# Patient Record
Sex: Male | Born: 1965 | Race: White | Hispanic: No | Smoking: Never smoker
Health system: Southern US, Community
[De-identification: ages and names within clinical notes are randomized; demographics above are authoritative.]

## PROBLEM LIST (undated history)

## (undated) DIAGNOSIS — K921 Melena: Secondary | ICD-10-CM

## (undated) DIAGNOSIS — Z87442 Personal history of urinary calculi: Secondary | ICD-10-CM

## (undated) DIAGNOSIS — Z8719 Personal history of other diseases of the digestive system: Secondary | ICD-10-CM

## (undated) DIAGNOSIS — N119 Chronic tubulo-interstitial nephritis, unspecified: Secondary | ICD-10-CM

## (undated) DIAGNOSIS — E213 Hyperparathyroidism, unspecified: Secondary | ICD-10-CM

## (undated) DIAGNOSIS — Z8619 Personal history of other infectious and parasitic diseases: Secondary | ICD-10-CM

## (undated) HISTORY — DX: Hyperparathyroidism, unspecified: E21.3

## (undated) HISTORY — DX: Personal history of urinary calculi: Z87.442

## (undated) HISTORY — DX: Personal history of other infectious and parasitic diseases: Z86.19

## (undated) HISTORY — DX: Personal history of other diseases of the digestive system: Z87.19

## (undated) HISTORY — DX: Chronic tubulo-interstitial nephritis, unspecified: N11.9

## (undated) HISTORY — DX: Melena: K92.1

---
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
## 1998-02-22 HISTORY — PX: PARATHYROIDECTOMY: SHX19

## 1998-07-24 ENCOUNTER — Encounter (HOSPITAL_BASED_OUTPATIENT_CLINIC_OR_DEPARTMENT_OTHER): Payer: Self-pay | Admitting: General Surgery

## 1998-07-24 ENCOUNTER — Ambulatory Visit (HOSPITAL_COMMUNITY): Admission: RE | Admit: 1998-07-24 | Discharge: 1998-07-24 | Payer: Self-pay | Admitting: General Surgery

## 1998-08-01 ENCOUNTER — Encounter (HOSPITAL_BASED_OUTPATIENT_CLINIC_OR_DEPARTMENT_OTHER): Payer: Self-pay | Admitting: General Surgery

## 1998-08-04 ENCOUNTER — Ambulatory Visit (HOSPITAL_COMMUNITY): Admission: RE | Admit: 1998-08-04 | Discharge: 1998-08-05 | Payer: Self-pay | Admitting: General Surgery

## 1998-10-02 ENCOUNTER — Ambulatory Visit (HOSPITAL_COMMUNITY): Admission: RE | Admit: 1998-10-02 | Discharge: 1998-10-02 | Payer: Self-pay | Admitting: Urology

## 1998-10-02 ENCOUNTER — Encounter: Payer: Self-pay | Admitting: Urology

## 1999-02-20 ENCOUNTER — Other Ambulatory Visit: Admission: RE | Admit: 1999-02-20 | Discharge: 1999-02-20 | Payer: Self-pay | Admitting: Urology

## 1999-10-05 ENCOUNTER — Encounter: Admission: RE | Admit: 1999-10-05 | Discharge: 1999-10-05 | Payer: Self-pay | Admitting: Urology

## 1999-10-05 ENCOUNTER — Encounter: Payer: Self-pay | Admitting: Urology

## 2000-03-24 ENCOUNTER — Encounter: Admission: RE | Admit: 2000-03-24 | Discharge: 2000-03-24 | Payer: Self-pay | Admitting: Nephrology

## 2000-03-24 ENCOUNTER — Encounter: Payer: Self-pay | Admitting: Nephrology

## 2001-04-21 ENCOUNTER — Encounter: Payer: Self-pay | Admitting: Nephrology

## 2001-04-21 ENCOUNTER — Encounter: Admission: RE | Admit: 2001-04-21 | Discharge: 2001-04-21 | Payer: Self-pay | Admitting: Nephrology

## 2002-10-04 ENCOUNTER — Ambulatory Visit (HOSPITAL_BASED_OUTPATIENT_CLINIC_OR_DEPARTMENT_OTHER): Admission: RE | Admit: 2002-10-04 | Discharge: 2002-10-04 | Payer: Self-pay | Admitting: Urology

## 2002-10-04 ENCOUNTER — Encounter: Payer: Self-pay | Admitting: Urology

## 2005-04-30 ENCOUNTER — Emergency Department (HOSPITAL_COMMUNITY): Admission: EM | Admit: 2005-04-30 | Discharge: 2005-04-30 | Payer: Self-pay | Admitting: Emergency Medicine

## 2010-06-21 ENCOUNTER — Emergency Department (HOSPITAL_COMMUNITY)
Admission: EM | Admit: 2010-06-21 | Discharge: 2010-06-21 | Disposition: A | Discharge status: Home / Self Care | Payer: 59 | Attending: Emergency Medicine | Admitting: Emergency Medicine

## 2010-06-21 DIAGNOSIS — S61209A Unspecified open wound of unspecified finger without damage to nail, initial encounter: Secondary | ICD-10-CM | POA: Insufficient documentation

## 2010-06-21 DIAGNOSIS — W260XXA Contact with knife, initial encounter: Secondary | ICD-10-CM | POA: Insufficient documentation

## 2010-06-21 DIAGNOSIS — Y92009 Unspecified place in unspecified non-institutional (private) residence as the place of occurrence of the external cause: Secondary | ICD-10-CM | POA: Insufficient documentation

## 2010-06-21 DIAGNOSIS — Z79899 Other long term (current) drug therapy: Secondary | ICD-10-CM | POA: Insufficient documentation

## 2010-06-21 DIAGNOSIS — M79609 Pain in unspecified limb: Secondary | ICD-10-CM | POA: Insufficient documentation

## 2011-06-25 ENCOUNTER — Other Ambulatory Visit: Payer: Self-pay | Admitting: Family Medicine

## 2011-06-25 DIAGNOSIS — E039 Hypothyroidism, unspecified: Secondary | ICD-10-CM

## 2011-06-28 ENCOUNTER — Other Ambulatory Visit: Payer: 59

## 2012-06-12 ENCOUNTER — Encounter: Payer: Self-pay | Admitting: *Deleted

## 2013-10-16 LAB — HM COLONOSCOPY

## 2014-05-30 ENCOUNTER — Other Ambulatory Visit: Payer: Self-pay | Admitting: Family Medicine

## 2014-05-30 ENCOUNTER — Ambulatory Visit
Admission: RE | Admit: 2014-05-30 | Discharge: 2014-05-30 | Disposition: A | Discharge status: Home / Self Care | Payer: 59 | Source: Ambulatory Visit | Attending: Family Medicine | Admitting: Family Medicine

## 2014-05-30 DIAGNOSIS — R059 Cough, unspecified: Secondary | ICD-10-CM

## 2014-05-30 DIAGNOSIS — R05 Cough: Secondary | ICD-10-CM

## 2014-07-16 ENCOUNTER — Telehealth: Payer: Self-pay | Admitting: *Deleted

## 2014-07-16 NOTE — Telephone Encounter (Signed)
Unable to reach patient at time of Pre-Visit Call.  Left message for patient to return call when available.    

## 2014-07-17 ENCOUNTER — Encounter: Payer: Self-pay | Admitting: Family

## 2014-07-17 ENCOUNTER — Ambulatory Visit (INDEPENDENT_AMBULATORY_CARE_PROVIDER_SITE_OTHER): Payer: 59 | Admitting: Family

## 2014-07-17 ENCOUNTER — Telehealth: Payer: Self-pay | Admitting: Family

## 2014-07-17 VITALS — BP 109/83 | HR 107 | Temp 98.1°F | Resp 16 | Ht 73.0 in | Wt 222.0 lb

## 2014-07-17 DIAGNOSIS — M25569 Pain in unspecified knee: Secondary | ICD-10-CM | POA: Insufficient documentation

## 2014-07-17 DIAGNOSIS — L989 Disorder of the skin and subcutaneous tissue, unspecified: Secondary | ICD-10-CM | POA: Insufficient documentation

## 2014-07-17 DIAGNOSIS — Z0000 Encounter for general adult medical examination without abnormal findings: Secondary | ICD-10-CM | POA: Diagnosis not present

## 2014-07-17 DIAGNOSIS — Z87442 Personal history of urinary calculi: Secondary | ICD-10-CM | POA: Insufficient documentation

## 2014-07-17 DIAGNOSIS — E213 Hyperparathyroidism, unspecified: Secondary | ICD-10-CM | POA: Insufficient documentation

## 2014-07-17 DIAGNOSIS — F411 Generalized anxiety disorder: Secondary | ICD-10-CM

## 2014-07-17 DIAGNOSIS — E039 Hypothyroidism, unspecified: Secondary | ICD-10-CM | POA: Diagnosis not present

## 2014-07-17 LAB — BASIC METABOLIC PANEL
BUN: 19 mg/dL (ref 6–23)
CALCIUM: 10.3 mg/dL (ref 8.4–10.5)
CO2: 30 meq/L (ref 19–32)
Chloride: 99 mEq/L (ref 96–112)
Creatinine, Ser: 1.71 mg/dL — ABNORMAL HIGH (ref 0.40–1.50)
GFR: 45.11 mL/min — ABNORMAL LOW (ref >60.00)
Glucose, Bld: 95 mg/dL (ref 70–99)
Potassium: 3.6 mEq/L (ref 3.5–5.1)
SODIUM: 137 meq/L (ref 135–145)

## 2014-07-17 LAB — TSH: TSH: 5.27 u[IU]/mL — AB (ref 0.35–4.50)

## 2014-07-17 MED ORDER — ESCITALOPRAM OXALATE 10 MG PO TABS: ORAL_TABLET | ORAL | Status: DC

## 2014-07-17 NOTE — Assessment & Plan Note (Signed)
Trial of lexapro. Follow up in 1 month.

## 2014-07-17 NOTE — Assessment & Plan Note (Signed)
clinically stable on synthroid, continue same, obtain tsh.

## 2014-07-17 NOTE — Assessment & Plan Note (Signed)
Being managed by ortho (Dr. Magnus IvanBlackman)

## 2014-07-17 NOTE — Progress Notes (Signed)
Pre visit review using our clinic review tool, if applicable. No additional management support is needed unless otherwise documented below in the visit note. 

## 2014-07-17 NOTE — Progress Notes (Signed)
Subjective:    Patient ID: Dylan Ray, male    DOB: [birth year], 49 y.o.   MRN: [id number]  HPI  Dylan Ray is a 49 yr old male who presents today to establish care.   Pmhx is significant for the following:  Blood in the stool due to naproxyn.  Reports that this occurred "almost 30 years ago." He had colo last year neg except divertucla.  Denies hx of diverticultis.  Kidney stones- has seen Dr. Brunilda Payor remotely.   Hyperparathyroidism/chronic kidney disease- he is followed by Dr. Darrick Penna.  He has had parathyroidectomy.  He is not followed by Endocrinology.    Hypothyroid- has seen Dr. Paulino Rily in the past. Reports that his dose has been stable at .    Knee pain- (left knee) following with Dr. Allie Bossier.   Anxiety-  + anxiety related to recent divorce and financial stress. Reports that he used to take lexapro in the past and would like to restart. Reports frequently feeling panic when he begins to think about money. Spends a lot of time alone at night since his divorce which is difficult at time. Denies SI/HI.    Requests derm referral for skin lesion on his left cheek and lesion beneath the hairline left frontal scalp.   Review of Systems  Constitutional: Negative for unexpected weight change.  HENT: Negative for rhinorrhea.   Respiratory: Negative for cough.   Cardiovascular: Negative for chest pain.  Gastrointestinal: Negative for blood in stool.  Genitourinary: Negative for dysuria and frequency.  Musculoskeletal: Positive for arthralgias.  Skin: Negative for rash.  Neurological: Negative for headaches.  Hematological: Negative for adenopathy.   See HPI  Past Medical History  Diagnosis Date  . Blood in stool     due to Naprosyn  . History of chicken pox   . History of diverticulitis   . History of kidney stones   . Hyperparathyroidism   . Chronic kidney disease     due to hyperparathyroidism    History   Social History  . Marital Status: Married     Spouse Name: N/A  . Number of Children: N/A  . Years of Education: N/A   Occupational History  . Not on file.   Social History Main Topics  . Smoking status: Never Smoker   . Smokeless tobacco: Not on file  . Alcohol Use: 0.6 oz/week    1 Cans of beer per week  . Drug Use: No  . Sexual Activity: Not on file   Other Topics Concern  . Not on file   Social History Narrative  . No narrative on file    Past Surgical History  Procedure Laterality Date  . Parathyroidectomy  2000  . Adenoidectomy  1969    Family History  Problem Relation Age of Onset  . Arthritis Mother   . Hyperlipidemia Mother   . Hypertension Mother   . Depression Mother   . Arthritis Maternal Grandmother   . Hyperlipidemia Maternal Grandmother   . Hypertension Maternal Grandmother     Allergies  Allergen Reactions  . Erythromycin Other (See Comments)    Sour stomach  . Naprosyn [Naproxen] Other (See Comments)    Black stools (?GI bleed)    No current outpatient prescriptions on file prior to visit.   No current facility-administered medications on file prior to visit.    BP 109/83 mmHg  Pulse 107  Temp(Src) 98.1 F (36.7 C) (Oral)  Resp 16  Ht  (1.854 m)  Wt 222 lb (100.699 kg)  BMI 29.30 kg/m2  SpO2 97%       Objective:   Physical Exam  Constitutional: He is oriented to person, place, and time. He appears well-developed and well-nourished. No distress.  HENT:  Head: Normocephalic and atraumatic.  Eyes: No scleral icterus.  Neck: No thyromegaly present.  Cardiovascular: Normal rate and regular rhythm.   No murmur heard. Pulmonary/Chest: Effort normal and breath sounds normal. No respiratory distress. He has no wheezes. He has no rales.  Abdominal: Soft. He exhibits no distension. There is no tenderness.  Musculoskeletal: He exhibits no edema.  Lymphadenopathy:    He has no cervical adenopathy.  Neurological: He is alert and oriented to person, place, and time.    Skin: Skin is warm and dry.  Raised skin lesion left lower cheek and above frontal hairline on left  Psychiatric: He has a normal mood and affect. His behavior is normal. Thought content normal.          Assessment & Plan:

## 2014-07-17 NOTE — Assessment & Plan Note (Signed)
He has seen Dr. Brunilda PayorNesi remotely.  Reports that he had kidney stones prior to his parathyroidectomy. No current complaints.

## 2014-07-17 NOTE — Assessment & Plan Note (Signed)
Pt requests referral to derm. Referral made.

## 2014-07-17 NOTE — Telephone Encounter (Signed)
Left detailed message re: below recommendation and will recheck at f/u on 08/22/14 and to call if any questions.

## 2014-07-17 NOTE — Assessment & Plan Note (Signed)
S/p parathyroidectomy. Not currently followed by endo. Will obtain PTH and calcium levels.

## 2014-07-17 NOTE — Patient Instructions (Signed)
Please complete lab work prior to leaving. Start lexapro 1/2 tab once daily for 1 week, then increase to a full tab once daily on week two. Please schedule a follow up appointment in 1 month. Welcome to [facility]!

## 2014-07-17 NOTE — Telephone Encounter (Signed)
Labs are showing that his Thyroid med is needing to be increased. He indicated that he often misses doses. I would recommend that he take dose daily and repeat tsh in 1 month, if necessary we will increase dose at that time.

## 2014-07-18 ENCOUNTER — Telehealth: Payer: Self-pay | Admitting: Family

## 2014-07-18 LAB — PTH, INTACT AND CALCIUM
Calcium: 10 mg/dL (ref 8.4–10.5)
PTH: 19 pg/mL (ref 14–64)

## 2014-07-18 LAB — HIV ANTIBODY (ROUTINE TESTING W REFLEX): HIV 1&2 Ab, 4th Generation: NONREACTIVE

## 2014-07-18 NOTE — Telephone Encounter (Signed)
Caller name:Fessenden Tremaine Relation to MW:UXLKpt:self Call back [phone] [facility]  Reason for call: pt states that his rx for escitalopram (LEXAPRO) 10 MG tablet, was [facility] to [facility] in [place], pt requesting that the [facility] is [facility] [address]

## 2014-07-19 ENCOUNTER — Encounter: Payer: Self-pay | Admitting: Family

## 2014-07-19 MED ORDER — ESCITALOPRAM OXALATE 10 MG PO TABS: ORAL_TABLET | ORAL | Status: DC

## 2014-07-19 NOTE — Telephone Encounter (Signed)
Rx sent to correct pharmacy. Notified pt.

## 2014-07-19 NOTE — Telephone Encounter (Signed)
Error/gd °

## 2014-08-06 ENCOUNTER — Telehealth: Payer: Self-pay | Admitting: Family

## 2014-08-06 MED ORDER — LEVOTHYROXINE SODIUM 75 MCG PO TABS: 75.0000 ug | ORAL_TABLET | Freq: Every day | ORAL | Status: DC

## 2014-08-06 NOTE — Telephone Encounter (Signed)
Refill sent. Left detailed message on voicemail re: Rx completion.

## 2014-08-06 NOTE — Telephone Encounter (Signed)
Caller name: Hamdan Relation to pt: self Call back number: [phone] Pharmacy: [facility] [address]   Reason for call:   Requesting synthroid refill

## 2014-08-12 ENCOUNTER — Telehealth: Payer: Self-pay | Admitting: *Deleted

## 2014-08-12 NOTE — Telephone Encounter (Signed)
Medical records received via fax from [facility]. Forwarded to [employer]. JG//CMA

## 2014-08-22 ENCOUNTER — Ambulatory Visit (INDEPENDENT_AMBULATORY_CARE_PROVIDER_SITE_OTHER): Payer: 59 | Admitting: Family

## 2014-08-22 ENCOUNTER — Encounter: Payer: Self-pay | Admitting: Family

## 2014-08-22 VITALS — BP 116/80 | HR 92 | Temp 97.2°F | Resp 16 | Ht 73.0 in | Wt 224.2 lb

## 2014-08-22 DIAGNOSIS — E213 Hyperparathyroidism, unspecified: Secondary | ICD-10-CM

## 2014-08-22 DIAGNOSIS — F411 Generalized anxiety disorder: Secondary | ICD-10-CM

## 2014-08-22 DIAGNOSIS — E039 Hypothyroidism, unspecified: Secondary | ICD-10-CM | POA: Diagnosis not present

## 2014-08-22 LAB — TSH: TSH: 1.55 u[IU]/mL (ref 0.35–4.50)

## 2014-08-22 MED ORDER — ESCITALOPRAM OXALATE 20 MG PO TABS: 20.0000 mg | ORAL_TABLET | Freq: Every day | ORAL | Status: DC

## 2014-08-22 NOTE — Assessment & Plan Note (Signed)
Obtain follow up TSH. Continue synthroid. 

## 2014-08-22 NOTE — Patient Instructions (Signed)
Please complete lab work prior to leaving. Increase lexapro from  to .

## 2014-08-22 NOTE — Progress Notes (Signed)
Subjective:    Patient ID: Colin Inahomas H Morimoto, male    DOB: 1965-07-16, 49 y.o.   MRN: [id number]  HPI  Mr. Ebony CargoClayton is a 49 yr old male who presents today for follow up.  1) Hypothyroid- admits to inconsistent use of levothyroxine.   2) Anxiety- currently maintained on lexapro 10mg .  Recent divorce.  Reports + anxiety.  Reports that he gets very anxious bout money but every time he thins about money he gets very anxious.  Reports that he looks forward to work.  Has been on lexapro in the past at 20mg  and feels that this dose worked better for him.    3) Hyperparathyroidism- s/p parathyroidectomy.  Had normal calcium and normal PTH in May.    Review of Systems See HPI  Past Medical History  Diagnosis Date  . Blood in stool     due to Naprosyn  . History of chicken pox   . History of diverticulitis   . History of kidney stones   . Hyperparathyroidism   . Chronic kidney disease     due to hyperparathyroidism    History   Social History  . Marital Status: Married    Spouse Name: N/A  . Number of Children: N/A  . Years of Education: N/A   Occupational History  . Not on file.   Social History Main Topics  . Smoking status: Never Smoker   . Smokeless tobacco: Not on file  . Alcohol Use: 0.6 oz/week    1 Cans of beer per week  . Drug Use: No  . Sexual Activity: Not on file   Other Topics Concern  . 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  television    Past Surgical History  Procedure Laterality Date  . Parathyroidectomy  2000  . Adenoidectomy  1969    Family History  Problem Relation Age of Onset  . Arthritis Mother   . Hyperlipidemia Mother   . Hypertension Mother   . Depression Mother   . Arthritis Maternal Grandmother   . Hyperlipidemia Maternal  Grandmother   . Hypertension Maternal Grandmother     Allergies  Allergen Reactions  . Erythromycin Other (See Comments)    Sour stomach  . Naprosyn [Naproxen] Other (See Comments)    Black stools (?GI bleed)    Current Outpatient Prescriptions on File Prior to Visit  Medication Sig Dispense Refill  . hydrochlorothiazide (HYDRODIURIL) 12.5 MG tablet Take 12.5 mg by mouth daily.    Marland Kitchen. levothyroxine (SYNTHROID, LEVOTHROID) 75 MCG tablet Take 1 tablet (75 mcg total) by mouth daily. 30 tablet 0  . potassium citrate (UROCIT-K) 10 MEQ (1080 MG) SR tablet Take 2 tablets by mouth 2 (two) times daily.      No current facility-administered medications on file prior to visit.    BP 116/80 mmHg  Pulse 92  Temp(Src) 97.2 F (36.2 C) (Oral)  Resp 16  Ht 6\' 1"  (1.854 m)  Wt 224 lb 3.2 oz (101.696 kg)  BMI 29.59 kg/m2  SpO2 98%       Objective:   Physical Exam  Constitutional: He is oriented to person, place, and time. He appears well-developed and well-nourished. No distress.  HENT:  Head: Normocephalic and atraumatic.  Cardiovascular: Normal rate and regular rhythm.  No murmur heard. Pulmonary/Chest: Effort normal and breath sounds normal. No respiratory distress. He has no wheezes. He has no rales.  Musculoskeletal: He exhibits no edema.  Neurological: He is alert and oriented to person, place, and time.  Skin: Skin is warm and dry.  Psychiatric: He has a normal mood and affect. His behavior is normal. Thought content normal.          Assessment & Plan:

## 2014-08-22 NOTE — Progress Notes (Signed)
Pre visit review using our clinic review tool, if applicable. No additional management support is needed unless otherwise documented below in the visit note. 

## 2014-08-22 NOTE — Assessment & Plan Note (Signed)
PTH and calcium normal last month.

## 2014-08-22 NOTE — Assessment & Plan Note (Signed)
Uncontrolled. Increase lexapro from  to .

## 2014-09-03 ENCOUNTER — Ambulatory Visit (HOSPITAL_BASED_OUTPATIENT_CLINIC_OR_DEPARTMENT_OTHER)
Admission: RE | Admit: 2014-09-03 | Discharge: 2014-09-03 | Disposition: A | Discharge status: Home / Self Care | Payer: 59 | Source: Ambulatory Visit | Attending: Medical | Admitting: Medical

## 2014-09-03 ENCOUNTER — Encounter: Payer: Self-pay | Admitting: Medical

## 2014-09-03 ENCOUNTER — Other Ambulatory Visit: Payer: Self-pay | Admitting: Medical

## 2014-09-03 ENCOUNTER — Ambulatory Visit (INDEPENDENT_AMBULATORY_CARE_PROVIDER_SITE_OTHER): Payer: 59 | Admitting: Medical

## 2014-09-03 VITALS — BP 135/80 | HR 98 | Temp 98.4°F | Ht 73.0 in | Wt 224.6 lb

## 2014-09-03 DIAGNOSIS — M25562 Pain in left knee: Secondary | ICD-10-CM | POA: Diagnosis not present

## 2014-09-03 DIAGNOSIS — M25462 Effusion, left knee: Secondary | ICD-10-CM | POA: Diagnosis not present

## 2014-09-03 LAB — CBC WITH DIFFERENTIAL/PLATELET
Basophils Absolute: 0 10*3/uL (ref 0.0–0.1)
Basophils Relative: 0.4 % (ref 0.0–3.0)
EOS ABS: 0.1 10*3/uL (ref 0.0–0.7)
EOS PCT: 1.1 % (ref 0.0–5.0)
HCT: 50.3 % (ref 39.0–52.0)
Hemoglobin: 16.8 g/dL (ref 13.0–17.0)
LYMPHS ABS: 1.4 10*3/uL (ref 0.7–4.0)
LYMPHS PCT: 15.4 % (ref 12.0–46.0)
MCHC: 33.3 g/dL (ref 30.0–36.0)
MCV: 88.5 fl (ref 78.0–100.0)
MONO ABS: 0.6 10*3/uL (ref 0.1–1.0)
Monocytes Relative: 6.8 % (ref 3.0–12.0)
NEUTROS PCT: 76.3 % (ref 43.0–77.0)
Neutro Abs: 7 10*3/uL (ref 1.4–7.7)
PLATELETS: 248 10*3/uL (ref 150.0–400.0)
RBC: 5.68 Mil/uL (ref 4.22–5.81)
RDW: 15.2 % (ref 11.5–15.5)
WBC: 9.2 10*3/uL (ref 4.0–10.5)

## 2014-09-03 LAB — URIC ACID: Uric Acid, Serum: 7.2 mg/dL (ref 4.0–7.8)

## 2014-09-03 MED ORDER — HYDROCODONE-ACETAMINOPHEN 5-325 MG PO TABS: 1.0000 | ORAL_TABLET | Freq: Four times a day (QID) | ORAL | Status: DC | PRN

## 2014-09-03 NOTE — Progress Notes (Signed)
Pre visit review using our clinic review tool, if applicable. No additional management support is needed unless otherwise documented below in the visit note. 

## 2014-09-03 NOTE — Assessment & Plan Note (Signed)
Xray of the left knee. Cbc, uric acid today. Take advil otc. Rx norco.  If uric acid were elevated then would dc advil and rx prednisone.  Will follow lab and xray. May refer to ortho in near future.

## 2014-09-03 NOTE — Progress Notes (Signed)
Subjective:    Patient ID: Dylan Ray, male    DOB: January 19, 1966, 49 y.o.   MRN: [id number]  HPI Pt left knee hurting Saturday, Sunday and Monday. No injury recently. But some chronic intermittent pain in the past. Pt states sees orthopedist in past. Pt may have had joint injection in the past. Pt not sure when last xray was. He mentions ortho told him if he comes in again may need arthroscopy. Pt took 4 advil this am. Did not help much. No hx of gout. But did have pseudogout in the past.   Review of Systems  Constitutional: Negative for fever, chills and fatigue.  Respiratory: Negative for chest tightness, shortness of breath and wheezing.   Cardiovascular: Negative for chest pain and palpitations.  Musculoskeletal:       Lt knee pain. Pain 4-5/10 now. This weekend 8/10.  Neurological: Negative for dizziness and light-headedness.  Hematological: Negative for adenopathy. Does not bruise/bleed easily.   Past Medical History  Diagnosis Date  . Blood in stool     due to Naprosyn  . History of chicken pox   . History of diverticulitis   . History of kidney stones   . Hyperparathyroidism   . Chronic kidney disease     due to hyperparathyroidism    History   Social History  . Marital Status: Married    Spouse Name: N/A  . Number of Children: N/A  . Years of Education: N/A   Occupational History  . Not on file.   Social History Main Topics  . Smoking status: Never Smoker   . Smokeless tobacco: Not on file  . Alcohol Use: 0.6 oz/week    1 Cans of beer per week  . Drug Use: No  . Sexual Activity: Not on file   Other Topics Concern  . 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  television    Past Surgical History  Procedure Laterality Date  . Parathyroidectomy  2000  .  Adenoidectomy  1969    Family History  Problem Relation Age of Onset  . Arthritis Mother   . Hyperlipidemia Mother   . Hypertension Mother   . Depression Mother   . Arthritis Maternal Grandmother   . Hyperlipidemia Maternal Grandmother   . Hypertension Maternal Grandmother     Allergies  Allergen Reactions  . Erythromycin Other (See Comments)    Sour stomach  . Naprosyn [Naproxen] Other (See Comments)    Black stools (?GI bleed)    Current Outpatient Prescriptions on File Prior to Visit  Medication Sig Dispense Refill  . escitalopram (LEXAPRO) 20 MG tablet Take 1 tablet (20 mg total) by mouth daily. 30 tablet 1  . hydrochlorothiazide (HYDRODIURIL) 12.5 MG tablet Take 12.5 mg by mouth daily.    Marland Kitchen. levothyroxine (SYNTHROID, LEVOTHROID) 75 MCG tablet Take 1 tablet (75 mcg total) by mouth daily. 30 tablet 0  . potassium citrate (UROCIT-K) 10 MEQ (1080 MG) SR tablet Take 2 tablets by mouth 2 (two) times daily.      No current facility-administered medications on file prior to visit.    BP 135/80 mmHg  Pulse 98  Temp(Src) 98.4 F (36.9 C) (Oral)  Ht 6\' 1"  (1.854 m)  Wt 224 lb 9.6 oz (101.878 kg)  BMI 29.64  kg/m2  SpO2 97%       Objective:   Physical Exam  General- No acute distress. Pleasant patient. Neck- Full range of motion, no jvd Lungs- Clear, even and unlabored. Heart- regular rate and rhythm. Neurologic- CNII- XII grossly intact.  Lt knee- moderate swelling- Mid warmth. Faint crepitus. No instability. Faint tenderness over lateral tibial plateau region.       Assessment & Plan:

## 2014-09-03 NOTE — Patient Instructions (Signed)
Knee pain Xray of the left knee. Cbc, uric acid today. Take advil otc. Rx norco.  If uric acid were elevated then would dc advil and rx prednisone.  Will follow lab and xray. May refer to ortho in near future.

## 2014-10-04 ENCOUNTER — Ambulatory Visit (INDEPENDENT_AMBULATORY_CARE_PROVIDER_SITE_OTHER): Payer: 59 | Admitting: Family

## 2014-10-04 ENCOUNTER — Encounter: Payer: Self-pay | Admitting: Family

## 2014-10-04 VITALS — BP 122/78 | HR 85 | Temp 97.7°F | Ht 73.0 in | Wt 225.0 lb

## 2014-10-04 DIAGNOSIS — F411 Generalized anxiety disorder: Secondary | ICD-10-CM

## 2014-10-04 MED ORDER — ESCITALOPRAM OXALATE 20 MG PO TABS: 20.0000 mg | ORAL_TABLET | Freq: Every day | ORAL | Status: DC

## 2014-10-04 NOTE — Progress Notes (Signed)
Subjective:    Patient ID: Dylan Ray, male    DOB: 09/17/1965, 49 y.o.   MRN: [id number]  HPI  Mr. Dylan Ray is a 49 yr old male who presents today for follow up of his anxiety. Last visit he noted ongoing anxiety symptoms and his lexapro dose was in creased from  to .  Reports that he is feeling better on this dose. Denies adverse side effects.   Review of Systems See HPI  Past Medical History  Diagnosis Date  . Blood in stool     due to Naprosyn  . History of chicken pox   . History of diverticulitis   . History of kidney stones   . Hyperparathyroidism   . Chronic kidney disease     due to hyperparathyroidism    Social History   Social History  . Marital Status: Married    Spouse Name: N/A  . Number of Children: N/A  . Years of Education: N/A   Occupational History  . Not on file.   Social History Main Topics  . Smoking status: Never Smoker   . Smokeless tobacco: Not on file  . Alcohol Use: 0.6 oz/week    1 Cans of beer per week  . Drug Use: No  . Sexual Activity: Not on file   Other Topics Concern  . 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  television    Past Surgical History  Procedure Laterality Date  . Parathyroidectomy  2000  . Adenoidectomy  1969    Family History  Problem Relation Age of Onset  . Arthritis Mother   . Hyperlipidemia Mother   . Hypertension Mother   . Depression Mother   . Arthritis Maternal Grandmother   . Hyperlipidemia Maternal Grandmother   . Hypertension Maternal Grandmother     Allergies  Allergen Reactions  . Erythromycin Other (See Comments)    Sour stomach  . Naprosyn [Naproxen] Other (See Comments)    Black stools (?GI bleed)    Current Outpatient Prescriptions on File Prior to Visit  Medication  Sig Dispense Refill  . escitalopram (LEXAPRO) 20 MG tablet Take 1 tablet (20 mg total) by mouth daily. 30 tablet 1  . hydrochlorothiazide (HYDRODIURIL) 12.5 MG tablet Take 12.5 mg by mouth daily.    Marland Kitchen HYDROcodone-acetaminophen (NORCO) 5-325 MG per tablet Take 1 tablet by mouth every 6 (six) hours as needed for moderate pain. 20 tablet 0  . levothyroxine (SYNTHROID, LEVOTHROID) 75 MCG tablet Take 1 tablet (75 mcg total) by mouth daily. 30 tablet 0  . potassium citrate (UROCIT-K) 10 MEQ (1080 MG) SR tablet Take 2 tablets by mouth 2 (two) times daily.      No current facility-administered medications on file prior to visit.    BP 122/78 mmHg  Pulse 85  Temp(Src) 97.7 F (36.5 C) (Oral)  Ht  (1.854 m)  Wt 225 lb (102.059 kg)  BMI 29.69 kg/m2  SpO2 96%       Objective:   Physical Exam  Constitutional: He is oriented to person, place, and time. He appears well-developed and well-nourished.  Neurological: He is alert and oriented to person, place, and time.  Psychiatric: He has a normal mood and affect. His behavior is normal. Judgment and thought content normal.  Assessment & Plan:

## 2014-10-04 NOTE — Patient Instructions (Signed)
Continue lexapro  ?

## 2014-10-04 NOTE — Assessment & Plan Note (Signed)
Stable on lexapro.  Continue same . 

## 2014-10-04 NOTE — Progress Notes (Signed)
Pre visit review using our clinic review tool, if applicable. No additional management support is needed unless otherwise documented below in the visit note. 

## 2014-10-17 ENCOUNTER — Encounter: Payer: Self-pay | Admitting: Family

## 2014-11-08 ENCOUNTER — Encounter: Payer: Self-pay | Admitting: Medical

## 2014-11-08 ENCOUNTER — Ambulatory Visit (INDEPENDENT_AMBULATORY_CARE_PROVIDER_SITE_OTHER): Payer: 59 | Admitting: Medical

## 2014-11-08 VITALS — BP 131/85 | HR 95 | Temp 97.8°F | Ht 73.0 in | Wt 227.6 lb

## 2014-11-08 DIAGNOSIS — M545 Low back pain, unspecified: Secondary | ICD-10-CM

## 2014-11-08 DIAGNOSIS — M62830 Muscle spasm of back: Secondary | ICD-10-CM

## 2014-11-08 MED ORDER — TIZANIDINE HCL 6 MG PO CAPS: 6.0000 mg | ORAL_CAPSULE | Freq: Three times a day (TID) | ORAL | Status: DC | PRN

## 2014-11-08 MED ORDER — KETOROLAC TROMETHAMINE 60 MG/2ML IM SOLN
60.0000 mg | Freq: Once | INTRAMUSCULAR | Status: AC
  Administered 2014-11-08: 60 mg via INTRAMUSCULAR

## 2014-11-08 NOTE — Progress Notes (Signed)
Subjective:    Patient ID: Dylan Ray, male    DOB: 12-12-1965, 49 y.o.   MRN: [id number]  HPI   Pt in with lower back pain and spasms. This is going on for 8 days. Pt states he did not do anything abnormal(no injury). Occasional pain in back but more on rt side. Daily spasms and pain. On and off pain in back in past. Self limited and often in past responds to toradol quickly.  Pt is taking motrin 800 mg for this.   No pain shooting down legs, no saddle anesthesia, no weakness of leg . No numbness. No foot drop. Pt states old rx hydrocodone expired.   Review of Systems  Constitutional: Negative for fever, chills and fatigue.  Respiratory: Negative for cough, chest tightness and wheezing.   Cardiovascular: Negative for chest pain and palpitations.  Gastrointestinal: Negative for nausea, vomiting and abdominal pain.  Genitourinary: Negative for dysuria, urgency, hematuria and flank pain.  Musculoskeletal: Positive for back pain.  Neurological: Negative for weakness and numbness.     Past Medical History  Diagnosis Date  . Blood in stool     due to Naprosyn  . History of chicken pox   . History of diverticulitis   . History of kidney stones   . Hyperparathyroidism   . Chronic kidney disease     due to hyperparathyroidism    Social History   Social History  . Marital Status: Married    Spouse Name: N/A  . Number of Children: N/A  . Years of Education: N/A   Occupational History  . Not on file.   Social History Main Topics  . Smoking status: Never Smoker   . Smokeless tobacco: Not on file  . Alcohol Use: 0.6 oz/week    1 Cans of beer per week  . Drug Use: No  . Sexual Activity: Not on file   Other Topics Concern  . 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   television    Past Surgical History  Procedure Laterality Date  . Parathyroidectomy  2000  . Adenoidectomy  1969    Family History  Problem Relation Age of Onset  . Arthritis Mother   . Hyperlipidemia Mother   . Hypertension Mother   . Depression Mother   . Arthritis Maternal Grandmother   . Hyperlipidemia Maternal Grandmother   . Hypertension Maternal Grandmother     Allergies  Allergen Reactions  . Erythromycin Other (See Comments)    Sour stomach  . Naprosyn [Naproxen] Other (See Comments)    Black stools (?GI bleed)    Current Outpatient Prescriptions on File Prior to Visit  Medication Sig Dispense Refill  . escitalopram (LEXAPRO) 20 MG tablet Take 1 tablet (20 mg total) by mouth daily. 90 tablet 0  . hydrochlorothiazide (HYDRODIURIL) 12.5 MG tablet Take 12.5 mg by mouth daily.    Marland Kitchen HYDROcodone-acetaminophen (NORCO) 5-325 MG per tablet Take 1 tablet by mouth every 6 (six) hours as needed for moderate pain. 20 tablet 0  . levothyroxine (SYNTHROID, LEVOTHROID) 75 MCG tablet Take 1 tablet (75 mcg total) by mouth daily. 30 tablet 0  . potassium citrate (UROCIT-K) 10 MEQ (1080 MG) SR tablet Take 2 tablets by mouth 2 (two) times daily.  No current facility-administered medications on file prior to visit.    BP 131/85 mmHg  Pulse 95  Temp(Src) 97.8 F (36.6 C) (Oral)  Ht 6\' 1"  (1.854 m)  Wt 227 lb 9.6 oz (103.239 kg)  BMI 30.03 kg/m2  SpO2 96%       Objective:   Physical Exam  General Appearance- Not in acute distress.    Chest and Lung Exam Auscultation: Breath sounds:-Normal. Clear even and unlabored. Adventitious sounds:- No Adventitious sounds.  Cardiovascular Auscultation:Rythm - Regular, rate and rythm. Heart Sounds -Normal heart sounds.  Abdomen Inspection:-Inspection Normal.  Palpation/Perucssion: Palpation and Percussion of the abdomen reveal- Non Tender, No Rebound tenderness, No rigidity(Guarding) and No Palpable abdominal masses.    Liver:-Normal.  Spleen:- Normal.   Back Pain rt side para  lumbar spine tenderness to palpation. Pain on straight leg lift. Pain on lateral movements and flexion/extension of the spine.  Lower ext neurologic  L5-S1 sensation intact bilaterally. Normal patellar reflexes bilaterally. No foot drop bilaterally.      Assessment & Plan:  For your back pain. Gave toradol 60 mg im. Starting tomorrow use ibuprofen 800 mg 3 times daily. Also zanaflex and back stretching exercises. Rx advisement given. If pain persisting then PT and consider xray.  Follow up in 10 days or as needed

## 2014-11-08 NOTE — Progress Notes (Signed)
Pre visit review using our clinic review tool, if applicable. No additional management support is needed unless otherwise documented below in the visit note. 

## 2014-11-08 NOTE — Patient Instructions (Signed)
For your back pain. Gave toradol 60 mg im. Starting tomorrow use ibuprofen 800 mg 3 times daily. Also zanaflex and back stretching exercises. Rx advisement given. If pain persisting then PT and consider xray.  Follow up in 10 days or as needed

## 2014-11-15 ENCOUNTER — Encounter: Payer: Self-pay | Admitting: Family

## 2014-11-15 DIAGNOSIS — F32A Depression, unspecified: Secondary | ICD-10-CM | POA: Insufficient documentation

## 2014-11-15 DIAGNOSIS — E785 Hyperlipidemia, unspecified: Secondary | ICD-10-CM | POA: Insufficient documentation

## 2014-11-15 DIAGNOSIS — F329 Major depressive disorder, single episode, unspecified: Secondary | ICD-10-CM | POA: Insufficient documentation

## 2014-11-15 DIAGNOSIS — F909 Attention-deficit hyperactivity disorder, unspecified type: Secondary | ICD-10-CM | POA: Insufficient documentation

## 2014-11-15 DIAGNOSIS — E559 Vitamin D deficiency, unspecified: Secondary | ICD-10-CM | POA: Insufficient documentation

## 2014-11-15 DIAGNOSIS — N289 Disorder of kidney and ureter, unspecified: Secondary | ICD-10-CM | POA: Insufficient documentation

## 2014-12-03 ENCOUNTER — Telehealth: Payer: Self-pay | Admitting: Family

## 2014-12-03 DIAGNOSIS — N289 Disorder of kidney and ureter, unspecified: Secondary | ICD-10-CM

## 2014-12-03 NOTE — Telephone Encounter (Signed)
Pt ins requires referral. He has appt 12/20/14 11:00am with Dr. Beryle Lathe, Nephrology, Fax # [phone]. Please enter referral and f/u as needed. Pt had kidney damage/calcified kidneys years ago and does f/u routinely.

## 2014-12-03 NOTE — Telephone Encounter (Signed)
Candise Bowens-- Referral placed. Can you obtain ins. Auth?  Thanks!

## 2014-12-04 NOTE — Telephone Encounter (Signed)
marji done, [employer] # [id number]

## 2015-01-03 ENCOUNTER — Ambulatory Visit: Payer: 59 | Admitting: Family

## 2015-01-10 ENCOUNTER — Ambulatory Visit: Payer: 59 | Admitting: Family

## 2015-01-24 ENCOUNTER — Ambulatory Visit (INDEPENDENT_AMBULATORY_CARE_PROVIDER_SITE_OTHER): Payer: 59 | Admitting: Family

## 2015-01-24 ENCOUNTER — Encounter: Payer: Self-pay | Admitting: Family

## 2015-01-24 VITALS — BP 120/82 | HR 87 | Temp 97.0°F | Resp 16 | Ht 73.0 in | Wt 231.8 lb

## 2015-01-24 DIAGNOSIS — Z23 Encounter for immunization: Secondary | ICD-10-CM

## 2015-01-24 DIAGNOSIS — F411 Generalized anxiety disorder: Secondary | ICD-10-CM | POA: Diagnosis not present

## 2015-01-24 DIAGNOSIS — E039 Hypothyroidism, unspecified: Secondary | ICD-10-CM

## 2015-01-24 DIAGNOSIS — R05 Cough: Secondary | ICD-10-CM

## 2015-01-24 DIAGNOSIS — N289 Disorder of kidney and ureter, unspecified: Secondary | ICD-10-CM | POA: Diagnosis not present

## 2015-01-24 DIAGNOSIS — R059 Cough, unspecified: Secondary | ICD-10-CM

## 2015-01-24 DIAGNOSIS — E559 Vitamin D deficiency, unspecified: Secondary | ICD-10-CM | POA: Diagnosis not present

## 2015-01-24 LAB — BASIC METABOLIC PANEL
BUN: 20 mg/dL (ref 6–23)
CALCIUM: 9.7 mg/dL (ref 8.4–10.5)
CO2: 29 mEq/L (ref 19–32)
CREATININE: 1.61 mg/dL — AB (ref 0.40–1.50)
Chloride: 105 mEq/L (ref 96–112)
GFR: 48.26 mL/min — AB (ref >60.00)
Glucose, Bld: 118 mg/dL — ABNORMAL HIGH (ref 70–99)
Potassium: 4 mEq/L (ref 3.5–5.1)
Sodium: 142 mEq/L (ref 135–145)

## 2015-01-24 LAB — VITAMIN D 25 HYDROXY (VIT D DEFICIENCY, FRACTURES): VITD: 24.38 ng/mL — AB (ref 30.00–100.00)

## 2015-01-24 LAB — TSH: TSH: 7.52 u[IU]/mL — ABNORMAL HIGH (ref 0.35–4.50)

## 2015-01-24 MED ORDER — LEVOTHYROXINE SODIUM 75 MCG PO TABS: 75.0000 ug | ORAL_TABLET | Freq: Every day | ORAL | Status: DC

## 2015-01-24 MED ORDER — ESCITALOPRAM OXALATE 20 MG PO TABS: 20.0000 mg | ORAL_TABLET | Freq: Every day | ORAL | Status: DC

## 2015-01-24 NOTE — Assessment & Plan Note (Signed)
Management per nephrology. Advised pt to avoid nsaids and to follow up with ortho re: bilateral knee pain

## 2015-01-24 NOTE — Progress Notes (Signed)
Subjective:    Patient ID: Dylan Ray, male    DOB: [birth year], 49 y.o.   MRN: [id number]  HPI   Dylan Ray is a 49 yr old male who presents today for follow up.  1) anxiety-  Currently maintained on lexapro . Patient reports that he sometimes forgets to take in the AM.  Reports good mood, anxiety well controlled.    2) Cough- reports that he has had a tight AM cough in the mornings.  Cough is mild.  Cough began a few days ago.    3) Renal insufficiency-  Last visit his was noted to have renal insufficiency. He was referred to nephrology. Patient is   Sees Dr. Magnus Ivan for knee pain.  Admits to NSAID use.  4) Hypothyroid- report non-compliance with synthroid.  Lab Results  Component Value Date   TSH 1.55 08/22/2014     Review of Systems See HPI  Past Medical History  Diagnosis Date  . Blood in stool     due to Naprosyn  . History of chicken pox   . History of diverticulitis   . History of kidney stones   . Hyperparathyroidism (HCC)   . Chronic interstitial nephritis     due to hyperparathyroidism- has seen dr. Darrick Penna                                                                                                                            aa   =-                       Social History   Social History  . Marital Status: Married    Spouse Name: N/A  . Number of Children: N/A  . Years of Education: N/A   Occupational History  . Not on file.   Social History Main Topics  . Smoking status: Never Smoker   . Smokeless tobacco: Not on file  . Alcohol Use: 0.6 oz/week    1 Cans of beer per week  . Drug Use: No  . Sexual Activity: Not on file   Other Topics Concern  . 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  television    Past Surgical History    Procedure Laterality Date  . Parathyroidectomy  2000  . Adenoidectomy  1969    Family History  Problem Relation Age of Onset  . Arthritis Mother   . Hyperlipidemia Mother   . Hypertension Mother   . Depression Mother   . Arthritis Maternal Grandmother   . Hyperlipidemia Maternal Grandmother   . Hypertension Maternal Grandmother     Allergies  Allergen Reactions  . Erythromycin Other (See Comments)    Sour stomach  . Naprosyn [Naproxen] Other (  See Comments)    Black stools (?GI bleed)    Current Outpatient Prescriptions on File Prior to Visit  Medication Sig Dispense Refill  . escitalopram (LEXAPRO) 20 MG tablet Take 1 tablet (20 mg total) by mouth daily. 90 tablet 0  . hydrochlorothiazide (HYDRODIURIL) 12.5 MG tablet Take 12.5 mg by mouth daily.    Marland Kitchen. HYDROcodone-acetaminophen (NORCO) 5-325 MG per tablet Take 1 tablet by mouth every 6 (six) hours as needed for moderate pain. 20 tablet 0  . levothyroxine (SYNTHROID, LEVOTHROID) 75 MCG tablet Take 1 tablet (75 mcg total) by mouth daily. 30 tablet 0  . potassium citrate (UROCIT-K) 10 MEQ (1080 MG) SR tablet Take 2 tablets by mouth 2 (two) times daily.      No current facility-administered medications on file prior to visit.    BP 120/82 mmHg  Pulse 87  Temp(Src) 97 F (36.1 C) (Oral)  Resp 16  Ht 6\' 1"  (1.854 m)  Wt 231 lb 12.8 oz (105.144 kg)  BMI 30.59 kg/m2  SpO2 97%       Objective:   Physical Exam  Constitutional: He is oriented to person, place, and time. He appears well-developed and well-nourished. No distress.  HENT:  Head: Normocephalic and atraumatic.  Cardiovascular: Normal rate and regular rhythm.   No murmur heard. Pulmonary/Chest: Effort normal and breath sounds normal. No respiratory distress. He has no wheezes. He has no rales.  Musculoskeletal: He exhibits no edema.  Neurological: He is alert and oriented to person, place, and time.  Skin: Skin is warm and dry.  Psychiatric: He has a normal  mood and affect. His behavior is normal. Thought content normal.          Assessment & Plan:  Cough- pt with mild cough- trial of zytec once daily- pt is advised to let me know if symptoms worsen or do not improve.    tdap today.

## 2015-01-24 NOTE — Patient Instructions (Addendum)
Please complete lab work prior to leaving. Take meds daily. Avoid anti-inflammatories such motrin due to your kidneys. You may take tylenol as needed.

## 2015-01-24 NOTE — Assessment & Plan Note (Signed)
Stable on lexapro, discussed importance of med compliance.

## 2015-01-24 NOTE — Assessment & Plan Note (Signed)
Obtain TSH (expect it to be elevated).  Resume synthoid- discussed med compliance.

## 2015-01-24 NOTE — Addendum Note (Signed)
Addended by: Mervin KungFERGERSON, Camdynn Maranto A on: 01/24/2015 03:54 PM   Modules accepted: Orders

## 2015-01-24 NOTE — Progress Notes (Signed)
Pre visit review using our clinic review tool, if applicable. No additional management support is needed unless otherwise documented below in the visit note. 

## 2015-01-29 ENCOUNTER — Telehealth: Payer: Self-pay | Admitting: *Deleted

## 2015-01-29 DIAGNOSIS — E559 Vitamin D deficiency, unspecified: Secondary | ICD-10-CM

## 2015-01-29 DIAGNOSIS — E039 Hypothyroidism, unspecified: Secondary | ICD-10-CM

## 2015-01-29 NOTE — Telephone Encounter (Signed)
Pt called back in to provide a different number for call back. Pt says that he will be at [phone] after 8am.

## 2015-01-29 NOTE — Telephone Encounter (Signed)
-----   Message from Sandford CrazeMelissa O'Sullivan, NP sent at 01/26/2015  5:00 PM EST ----- Vitamin D level is low.  Advise patient to begin vit D 50000 units once weekly for 12 weeks, then repeat vit D level (dx Vit D deficiency).    TSH elevated, advise pt to restart synthroid daily, repeat tsh in 6 weeks, dx hypothyroid.   Kidney function is stable.

## 2015-01-29 NOTE — Telephone Encounter (Signed)
Notified pt and he voices understanding. He requests that I call him back at his desk [phone]) in 15 min.

## 2015-01-31 ENCOUNTER — Telehealth: Payer: Self-pay | Admitting: Family

## 2015-01-31 NOTE — Telephone Encounter (Signed)
Left message at below # to return my call.

## 2015-01-31 NOTE — Telephone Encounter (Signed)
Caller name: Self   Can be reached: [phone] (M)   Reason for call: Got him scheduled for both lab appointments and he wants the results of his Kidney Functions Test faxed to Dr. Darrick Pennaeterding

## 2015-01-31 NOTE — Telephone Encounter (Signed)
Pt called back and scheduled lab appts with the schedulers. Future lab orders entered.

## 2015-01-31 NOTE — Telephone Encounter (Signed)
Labs faxed, notified pt.

## 2015-03-13 ENCOUNTER — Other Ambulatory Visit: Payer: Self-pay

## 2015-04-17 ENCOUNTER — Other Ambulatory Visit: Payer: Self-pay

## 2015-07-25 ENCOUNTER — Ambulatory Visit: Payer: 59 | Admitting: Family

## 2017-10-11 ENCOUNTER — Emergency Department (HOSPITAL_COMMUNITY): Payer: No Typology Code available for payment source

## 2017-10-11 ENCOUNTER — Other Ambulatory Visit: Payer: Self-pay

## 2017-10-11 ENCOUNTER — Emergency Department (HOSPITAL_COMMUNITY)
Admission: EM | Admit: 2017-10-11 | Discharge: 2017-10-11 | Disposition: A | Discharge status: Home / Self Care | Payer: No Typology Code available for payment source | Attending: Emergency Medicine | Admitting: Emergency Medicine

## 2017-10-11 ENCOUNTER — Encounter (HOSPITAL_COMMUNITY): Payer: Self-pay

## 2017-10-11 DIAGNOSIS — Z79899 Other long term (current) drug therapy: Secondary | ICD-10-CM | POA: Insufficient documentation

## 2017-10-11 DIAGNOSIS — Y929 Unspecified place or not applicable: Secondary | ICD-10-CM | POA: Diagnosis not present

## 2017-10-11 DIAGNOSIS — E039 Hypothyroidism, unspecified: Secondary | ICD-10-CM | POA: Insufficient documentation

## 2017-10-11 DIAGNOSIS — S62347A Nondisplaced fracture of base of fifth metacarpal bone. left hand, initial encounter for closed fracture: Secondary | ICD-10-CM

## 2017-10-11 DIAGNOSIS — Y999 Unspecified external cause status: Secondary | ICD-10-CM | POA: Insufficient documentation

## 2017-10-11 DIAGNOSIS — S0181XA Laceration without foreign body of other part of head, initial encounter: Secondary | ICD-10-CM | POA: Diagnosis not present

## 2017-10-11 DIAGNOSIS — W01190A Fall on same level from slipping, tripping and stumbling with subsequent striking against furniture, initial encounter: Secondary | ICD-10-CM | POA: Diagnosis not present

## 2017-10-11 DIAGNOSIS — Y9389 Activity, other specified: Secondary | ICD-10-CM | POA: Insufficient documentation

## 2017-10-11 DIAGNOSIS — S0990XA Unspecified injury of head, initial encounter: Secondary | ICD-10-CM | POA: Diagnosis present

## 2017-10-11 DIAGNOSIS — Z23 Encounter for immunization: Secondary | ICD-10-CM | POA: Diagnosis not present

## 2017-10-11 MED ORDER — LIDOCAINE-EPINEPHRINE (PF) 2 %-1:200000 IJ SOLN
10.0000 mL | Freq: Once | INTRAMUSCULAR | Status: AC
  Administered 2017-10-11: 10 mL
  Filled 2017-10-11: qty 20

## 2017-10-11 MED ORDER — TETANUS-DIPHTH-ACELL PERTUSSIS 5-2.5-18.5 LF-MCG/0.5 IM SUSP
0.5000 mL | Freq: Once | INTRAMUSCULAR | Status: AC
  Administered 2017-10-11: 0.5 mL via INTRAMUSCULAR
  Filled 2017-10-11: qty 0.5

## 2017-10-11 NOTE — Discharge Instructions (Signed)
The stitches can get wet just do not scrub or soak them.  Pull on the stitches in about 7 days to see if they are ready to come out.  When they are ready to come out you should be able to pull them out easily.

## 2017-10-11 NOTE — ED Notes (Signed)
Patient verbalized understanding of discharge instructions, no questions. Patient ambulated out of ED with steady gait in no distress.  

## 2017-10-11 NOTE — ED Provider Notes (Signed)
[facility]-EMERGENCY DEPT Provider Note   CSN: [id number] Arrival date & time: 10/11/17  0932     History   Chief Complaint Chief Complaint  Patient presents with  . Fall  . Head Injury  . Hand Injury    HPI Colin Inahomas H Wileman is a 52 y.o. male.  Patient is a 52 year old male with a history of interstitial nephritis, kidney stones and hyperparathyroidism presenting today after mechanical fall.  Patient states he was coming around the corner and there was a tote in the walkway that he tripped over.  He fell forward and hit his forehead on a chair on the way down and then caught himself with his left hand.  He denies loss of consciousness.  He has no pain in his legs and was able to ambulate after the fall.  He has not felt dizzy, lightheaded, nauseated since the fall.  It was a bleeding profusely and a gauze was placed on his head.  He does note he is having pain and swelling to the left portion of his left hand.  In the past he has had surgery in this area.  But the pain and swelling today are new.  Last tetanus is unknown  The history is provided by the patient.  Fall  This is a new problem. The current episode started less than 1 hour ago. The problem occurs constantly. The problem has not changed since onset.Associated symptoms comments: Left hand injury and pain and swelling.  No LOC or nausea or dazed from fall.  NO neck pain.  No anticoagulation.. Nothing aggravates the symptoms. Nothing relieves the symptoms. He has tried nothing for the symptoms.  Head Injury    Hand Injury      Past Medical History:  Diagnosis Date  . Blood in stool    due to Naprosyn  . Chronic interstitial nephritis    due to hyperparathyroidism- has seen dr. Darrick Pennadeterding                                                                                                                            aa   =-                     . History of chicken pox   . History of diverticulitis   .  History of kidney stones   . Hyperparathyroidism [facility](HCC)     Patient Active Problem List   Diagnosis Date Noted  . Vitamin D deficiency 11/15/2014  . Renal insufficiency 11/15/2014  . Depression 11/15/2014  . ADHD (attention deficit hyperactivity disorder) 11/15/2014  . Hyperlipidemia 11/15/2014  . History of kidney stones 07/17/2014  . Hyperparathyroidism (HCC) 07/17/2014  . Hypothyroidism 07/17/2014  . Skin lesion 07/17/2014  . Knee pain 07/17/2014  . Anxiety state 07/17/2014    Past Surgical History:  Procedure Laterality Date  . ADENOIDECTOMY  1969  . PARATHYROIDECTOMY  2000  Home Medications    Prior to Admission medications   Medication Sig Start Date End Date Taking? Authorizing Provider  escitalopram (LEXAPRO) 20 MG tablet Take 1 tablet (20 mg total) by mouth daily. 01/24/15   Sandford Craze'Sullivan, Melissa, NP  hydrochlorothiazide (HYDRODIURIL) 12.5 MG tablet Take 12.5 mg by mouth daily. 06/04/14   [provider]  HYDROcodone-acetaminophen (NORCO) 5-325 MG per tablet Take 1 tablet by mouth every 6 (six) hours as needed for moderate pain. 09/03/14   Saguier, Ramon DredgeEdward, PA-C  levothyroxine (SYNTHROID, LEVOTHROID) 75 MCG tablet Take 1 tablet (75 mcg total) by mouth daily. 01/24/15   Sandford Craze'Sullivan, Melissa, NP  potassium citrate (UROCIT-K) 10 MEQ (1080 MG) SR tablet Take 2 tablets by mouth 2 (two) times daily.  06/03/14   [provider]    Family History Family History  Problem Relation Age of Onset  . Arthritis Mother   . Hyperlipidemia Mother   . Hypertension Mother   . Depression Mother   . Arthritis Maternal Grandmother   . Hyperlipidemia Maternal Grandmother   . Hypertension Maternal Grandmother     Social History Social History   Tobacco Use  . Smoking status: Never Smoker  Substance Use Topics  . Alcohol use: Yes    Alcohol/week: 1.0 standard drinks    Types: 1 Cans of beer per week  . Drug use: No     Allergies   Erythromycin and  Naprosyn [naproxen]   Review of Systems Review of Systems  All other systems reviewed and are negative.    Physical Exam Updated Vital Signs BP (!) 168/89 (BP Location: Right Arm)   Pulse 81   Temp 98.4 F (36.9 C)   Resp 16   Ht 6\' 1"  (1.854 m)   Wt 99.8 kg   SpO2 96%   BMI 29.03 kg/m   Physical Exam  Constitutional: He is oriented to person, place, and time. He appears well-developed and well-nourished. No distress.  HENT:  Head: Normocephalic. Head is with laceration.    Mouth/Throat: Oropharynx is clear and moist.  Eyes: Pupils are equal, round, and reactive to light. Conjunctivae and EOM are normal.  Neck: Normal range of motion. Neck supple. No spinous process tenderness and no muscular tenderness present. Normal range of motion present.  Cardiovascular: Normal rate, regular rhythm and intact distal pulses.  No murmur heard. Pulmonary/Chest: Effort normal and breath sounds normal. No respiratory distress. He has no wheezes. He has no rales.  Abdominal: Soft. He exhibits no distension. There is no tenderness. There is no rebound and no guarding.  Musculoskeletal: Normal range of motion. He exhibits no edema.       Left wrist: Normal.       Left hand: He exhibits tenderness and bony tenderness. Normal sensation noted. Normal strength noted.       Hands: Neurological: He is alert and oriented to person, place, and time.  Skin: Skin is warm and dry. No rash noted. No erythema.  Psychiatric: He has a normal mood and affect. His behavior is normal.  Nursing note and vitals reviewed.    ED Treatments / Results  Labs (all labs ordered are listed, but only abnormal results are displayed) Labs Reviewed - No data to display  EKG None  Radiology Dg Hand Complete Left  Result Date: 10/11/2017 CLINICAL DATA:  Left hand pain fall. EXAM: LEFT HAND - COMPLETE 3+ VIEW COMPARISON:  04/30/2005. FINDINGS: Acute fracture of the base of the left fifth metacarpal noted. Old  healed fracture noted  of the distal left fifth metacarpal. Prominent callus formation noted. Soft tissue swelling noted over the left fifth metacarpal. No other focal abnormality. No radiopaque foreign body. IMPRESSION: 1. Acute fracture at the base of the left fifth metacarpal. Adjacent prominent soft tissue swelling. No radiopaque foreign body. 2. Old healed fracture distal aspect of the left fifth metacarpal with associated prominent callus formation. Electronically Signed   By: Maisie Fus  Register   On: 10/11/2017 11:10    Procedures Procedures (including critical care time) LACERATION REPAIR Performed by: [facility] Authorized by: Gwyneth Sprout Consent: Verbal consent obtained. Risks and benefits: risks, benefits and alternatives were discussed Consent given by: patient Patient identity confirmed: provided demographic data Prepped and Draped in normal sterile fashion Wound explored  Laceration Location: forehead  Laceration Length: 3cm  No Foreign Bodies seen or palpated  Anesthesia: local infiltration  Local anesthetic: lidocaine 2% with epinephrine  Anesthetic total: 4 ml  Irrigation method: syringe Amount of cleaning: standard  Skin closure: 6.0 vicryl  Number of sutures: 7  Technique: simple interrupted  Patient tolerance: Patient tolerated the procedure well with no immediate complications.   Medications Ordered in ED Medications  Tdap (BOOSTRIX) injection 0.5 mL (has no administration in time range)  lidocaine-EPINEPHrine (XYLOCAINE W/EPI) 2 %-1:200000 (PF) injection 10 mL (has no administration in time range)     Initial Impression / Assessment and Plan / ED Course  I have reviewed the triage vital signs and the nursing notes.  Pertinent labs & imaging results that were available during my care of the patient were reviewed by me and considered in my medical decision making (see chart for details).     52 year old male who is not anticoagulated  with a mechanical fall today.  Laceration to the forehead but no loss of consciousness or signs of head injury.  No indication for head or neck CT today as patient has full range of motion of his neck without pain and is otherwise feeling normally.  Tetanus shot was updated.  Wound repair as above.  Films of the hand are pending to rule out new acute fracture.  12:04 PM Imaging shows an acute fracture at the base of the left fifth metatarsal.  Patient is left-handed in an ulnar gutter splint was applied.  Patient will follow-up with hand surgery for further care.  Final Clinical Impressions(s) / ED Diagnoses   Final diagnoses:  Forehead laceration, initial encounter  Closed nondisplaced fracture of base of fifth metacarpal bone of left hand, initial encounter    ED Discharge Orders    None       Gwyneth Sprout, MD 10/11/17 1206

## 2017-10-11 NOTE — ED Notes (Signed)
Bed: WA21 Expected date:  Expected time:  Means of arrival:  Comments: EMS 52yo mechanical fall

## 2017-10-11 NOTE — ED Triage Notes (Signed)
Patient arrives via EMS from work. Patient tripped over a box at work, laceration to head, left hand pain. -LOC, -neck/back pain, no blood thinners.  BP:146/99 HR: 96

## 2019-03-03 IMAGING — CR DG HAND COMPLETE 3+V*L*
3 series · 3 of 3 positions shown · non-contrast
Comparison: 04/30/2005.

CLINICAL DATA: Left hand pain fall.

EXAM:
LEFT HAND - COMPLETE 3+ VIEW

[x hand pa left]
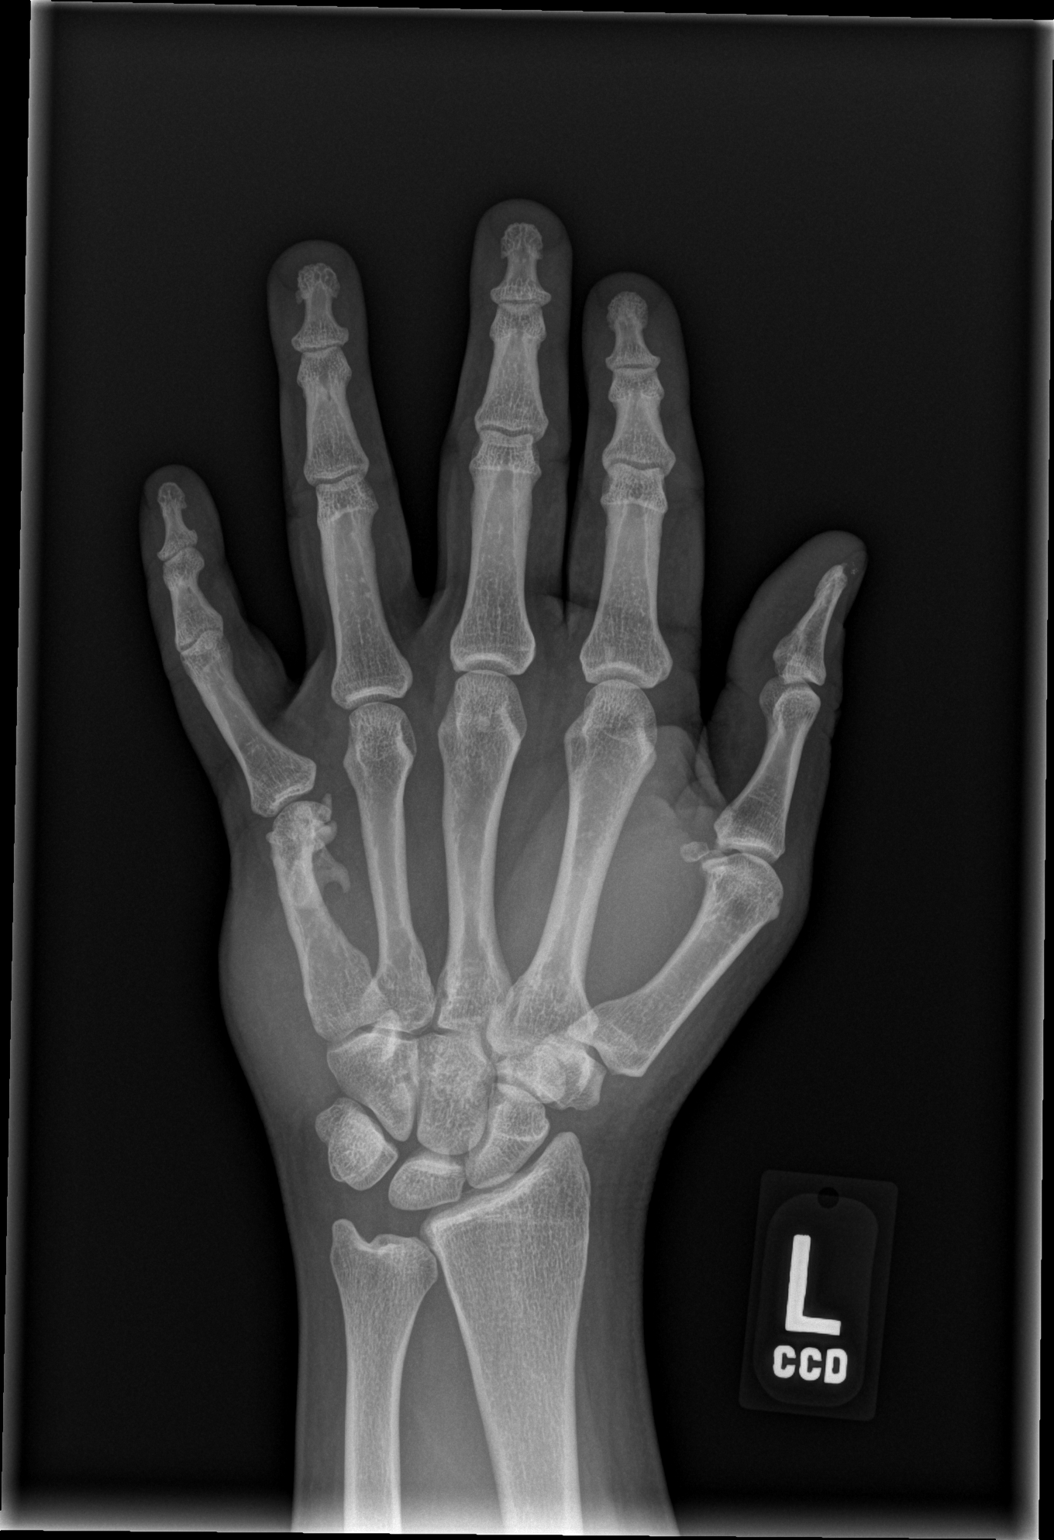

[x hand obl left]
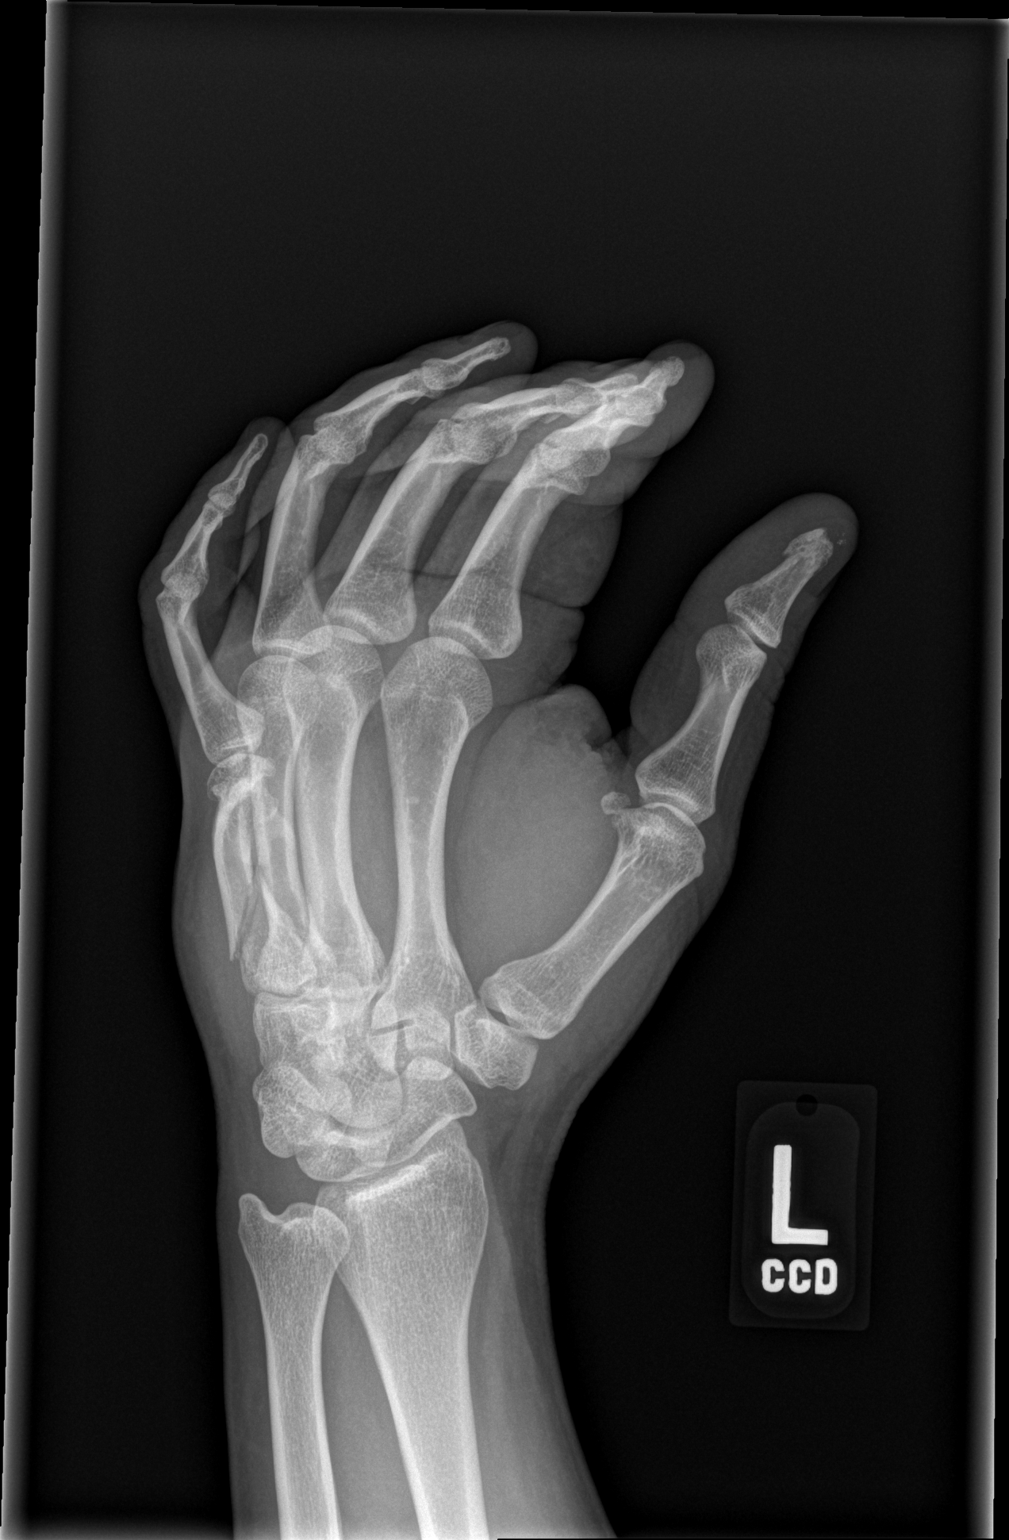

[x hand lat left]
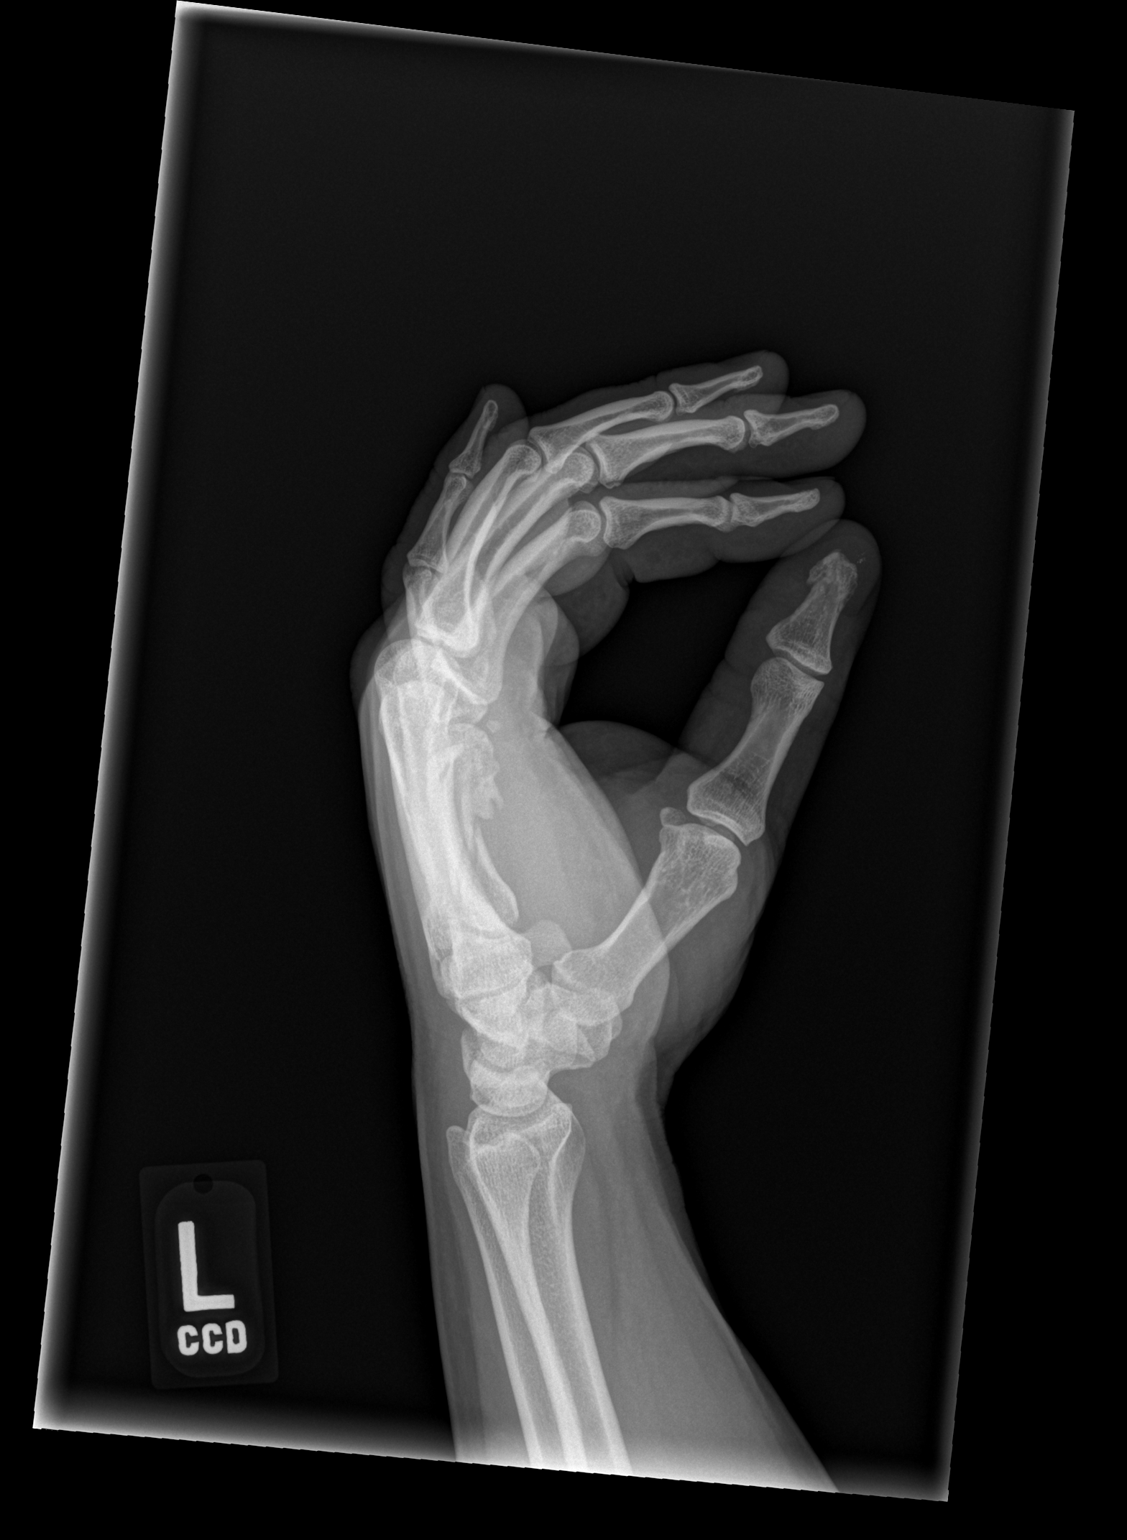

[3 of 3 positions shown; findings below may reference images not displayed]

FINDINGS: Acute fracture of the base of the left fifth metacarpal noted. Old
healed fracture noted of the distal left fifth metacarpal. Prominent
callus formation noted. Soft tissue swelling noted over the left
fifth metacarpal. No other focal abnormality. No radiopaque foreign
body.
IMPRESSION: 1. Acute fracture at the base of the left fifth metacarpal. Adjacent
prominent soft tissue swelling. No radiopaque foreign body.

2. Old healed fracture distal aspect of the left fifth metacarpal
with associated prominent callus formation.

## 2019-10-14 ENCOUNTER — Other Ambulatory Visit: Payer: Self-pay

## 2019-10-14 ENCOUNTER — Emergency Department (HOSPITAL_COMMUNITY)
Admission: EM | Admit: 2019-10-14 | Discharge: 2019-10-14 | Disposition: A | Discharge status: Home / Self Care | Payer: Self-pay | Attending: Emergency Medicine | Admitting: Emergency Medicine

## 2019-10-14 ENCOUNTER — Emergency Department (HOSPITAL_COMMUNITY): Payer: Self-pay

## 2019-10-14 ENCOUNTER — Encounter (HOSPITAL_COMMUNITY): Payer: Self-pay

## 2019-10-14 DIAGNOSIS — E039 Hypothyroidism, unspecified: Secondary | ICD-10-CM | POA: Insufficient documentation

## 2019-10-14 DIAGNOSIS — M10071 Idiopathic gout, right ankle and foot: Secondary | ICD-10-CM | POA: Insufficient documentation

## 2019-10-14 DIAGNOSIS — M109 Gout, unspecified: Secondary | ICD-10-CM

## 2019-10-14 MED ORDER — PREDNISONE 20 MG PO TABS: 40.0000 mg | ORAL_TABLET | Freq: Every day | ORAL | 0 refills | Status: AC

## 2019-10-14 MED ORDER — COLCHICINE 0.6 MG PO TABS
0.6000 mg | ORAL_TABLET | Freq: Once | ORAL | Status: AC
  Administered 2019-10-14: 0.6 mg via ORAL
  Filled 2019-10-14: qty 1

## 2019-10-14 MED ORDER — PREDNISONE 20 MG PO TABS: 40.0000 mg | ORAL_TABLET | Freq: Every day | ORAL | 0 refills | Status: DC

## 2019-10-14 MED ORDER — HYDROCODONE-ACETAMINOPHEN 5-325 MG PO TABS: 1.0000 | ORAL_TABLET | Freq: Four times a day (QID) | ORAL | 0 refills | Status: AC | PRN

## 2019-10-14 MED ORDER — PREDNISONE 20 MG PO TABS
40.0000 mg | ORAL_TABLET | Freq: Once | ORAL | Status: AC
  Administered 2019-10-14: 40 mg via ORAL
  Filled 2019-10-14: qty 2

## 2019-10-14 MED ORDER — OXYCODONE-ACETAMINOPHEN 5-325 MG PO TABS
1.0000 | ORAL_TABLET | Freq: Once | ORAL | Status: AC
  Administered 2019-10-14: 1 via ORAL
  Filled 2019-10-14: qty 1

## 2019-10-14 NOTE — ED Triage Notes (Signed)
Arrived POV from home. Patient reports right foot pain for past 2 days. Patient denies any injury but states his foot feels like it is broken.

## 2019-10-14 NOTE — ED Provider Notes (Signed)
[facility]-EMERGENCY DEPT Provider Note   CSN: [id number] Arrival date & time: 10/14/19  0500     History Chief Complaint  Patient presents with  . Foot Pain    Dylan Ray is a 54 y.o. male.  HPI   54 year old male with right foot/ankle pain. No trauma. Began about 2 days ago. Pain is been constant progressive since then. Worse with touch and any type of movement of the foot/ankle. Some mild the redness towards the medial aspect of the foot/ankle. No significant pain in any other joints. No fevers. Reports a past history of pseudogout.   Past Medical History:  Diagnosis Date  . Blood in stool    due to Naprosyn  . Chronic interstitial nephritis    due to hyperparathyroidism- has seen dr. Darrick Penna                                                                                                                            aa   =-                     . History of chicken pox   . History of diverticulitis   . History of kidney stones   . Hyperparathyroidism [facility])     Patient Active Problem List   Diagnosis Date Noted  . Vitamin D deficiency 11/15/2014  . Renal insufficiency 11/15/2014  . Depression 11/15/2014  . ADHD (attention deficit hyperactivity disorder) 11/15/2014  . Hyperlipidemia 11/15/2014  . History of kidney stones 07/17/2014  . Hyperparathyroidism (HCC) 07/17/2014  . Hypothyroidism 07/17/2014  . Skin lesion 07/17/2014  . Knee pain 07/17/2014  . Anxiety state 07/17/2014   Past Surgical History:  Procedure Laterality Date  . ADENOIDECTOMY  1969  . PARATHYROIDECTOMY  2000     Family History  Problem Relation Age of Onset  . Arthritis Mother   . Hyperlipidemia Mother   . Hypertension Mother   . Depression Mother   . Arthritis Maternal Grandmother   . Hyperlipidemia Maternal Grandmother   . Hypertension Maternal Grandmother    Social History   Tobacco Use  . Smoking status: Never Smoker  . Smokeless tobacco: Never Used    Substance Use Topics  . Alcohol use: Yes    Alcohol/week: 1.0 standard drink    Types: 1 Cans of beer per week  . Drug use: No    Home Medications Prior to Admission medications   Medication Sig Start Date End Date Taking? Authorizing Provider  ibuprofen (ADVIL) 200 MG tablet Take 600-800 mg by mouth every 6 (six) hours as needed (For back pain.).    [provider]   Allergies    Erythromycin and Naprosyn [naproxen]  Review of Systems   Review of Systems All systems reviewed and negative, other than as noted in HPI.  Physical Exam Updated Vital Signs BP 131/89   Pulse (!) 108   Temp 98.9 F (37.2 C) (Oral)  Resp 18   SpO2 93%   Physical Exam Vitals and nursing note reviewed.  Constitutional:      General: He is not in acute distress.    Appearance: He is well-developed.  HENT:     Head: Normocephalic and atraumatic.  Eyes:     General:        Right eye: No discharge.        Left eye: No discharge.     Conjunctiva/sclera: Conjunctivae normal.  Cardiovascular:     Rate and Rhythm: Normal rate and regular rhythm.     Heart sounds: Normal heart sounds. No murmur heard.  No friction rub. No gallop.   Pulmonary:     Effort: Pulmonary effort is normal. No respiratory distress.     Breath sounds: Normal breath sounds.  Abdominal:     General: There is no distension.     Palpations: Abdomen is soft.     Tenderness: There is no abdominal tenderness.  Musculoskeletal:        General: Tenderness present.     Cervical back: Neck supple.     Comments: Right lower extremity with some faint erythema along the medial forefoot and ankle. Significant pain with light touch. Severe pain in ankle with minimal passive range of motion. Palpable DP pulse.  Skin:    General: Skin is warm and dry.  Neurological:     Mental Status: He is alert.  Psychiatric:        Behavior: Behavior normal.        Thought Content: Thought content normal.     ED Results / Procedures  / Treatments   Labs (all labs ordered are listed, but only abnormal results are displayed) Labs Reviewed - No data to display  EKG None  Radiology DG Foot Complete Right  Result Date: 10/14/2019 CLINICAL DATA:  54 year old male with history of foot pain for the past 2 days. EXAM: RIGHT FOOT COMPLETE - 3+ VIEW COMPARISON:  No priors. FINDINGS: There is no evidence of fracture or dislocation. There is no evidence of arthropathy or other focal bone abnormality. Small enthesophytes off the plantar and dorsal aspect of the calcaneus. Soft tissues are unremarkable. IMPRESSION: No acute radiographic abnormality of the right foot. Electronically Signed   By: Trudie Reed M.D.   On: 10/14/2019 06:32    Procedures Procedures (including critical care time)  Medications Ordered in ED Medications  predniSONE (DELTASONE) tablet 40 mg (has no administration in time range)  oxyCODONE-acetaminophen (PERCOCET/ROXICET) 5-325 MG per tablet 1 tablet (has no administration in time range)  colchicine tablet 0.6 mg (has no administration in time range)    ED Course  I have reviewed the triage vital signs and the nursing notes.  Pertinent labs & imaging results that were available during my care of the patient were reviewed by me and considered in my medical decision making (see chart for details).    MDM Rules/Calculators/A&P                         54 year old male with atraumatic right foot/ankle pain. Clinically suspect gout. He reports history of mild renal renal impairment. Does drink "about a beer a day." Imaging negative. Afebrile. Doubt septic joint. 1 dose of colchicine here in the emergency room. Given reported renal insufficiency, will defer from additional doses and NSAIDs. PRN pain meds. Steroids. Outpt FU.    Final Clinical Impression(s) / ED Diagnoses Final diagnoses:  Acute gout of right  foot, unspecified cause    Rx / DC Orders ED Discharge Orders    None       Raeford Razor, MD 10/14/19 [phone]

## 2021-03-05 IMAGING — CR DG FOOT COMPLETE 3+V*R*
3 series · 3 of 3 positions shown · non-contrast
Comparison: No priors.

CLINICAL DATA: 54-year-old male with history of foot pain for the
past 2 days.

EXAM:
RIGHT FOOT COMPLETE - 3+ VIEW

[x foot ap right]
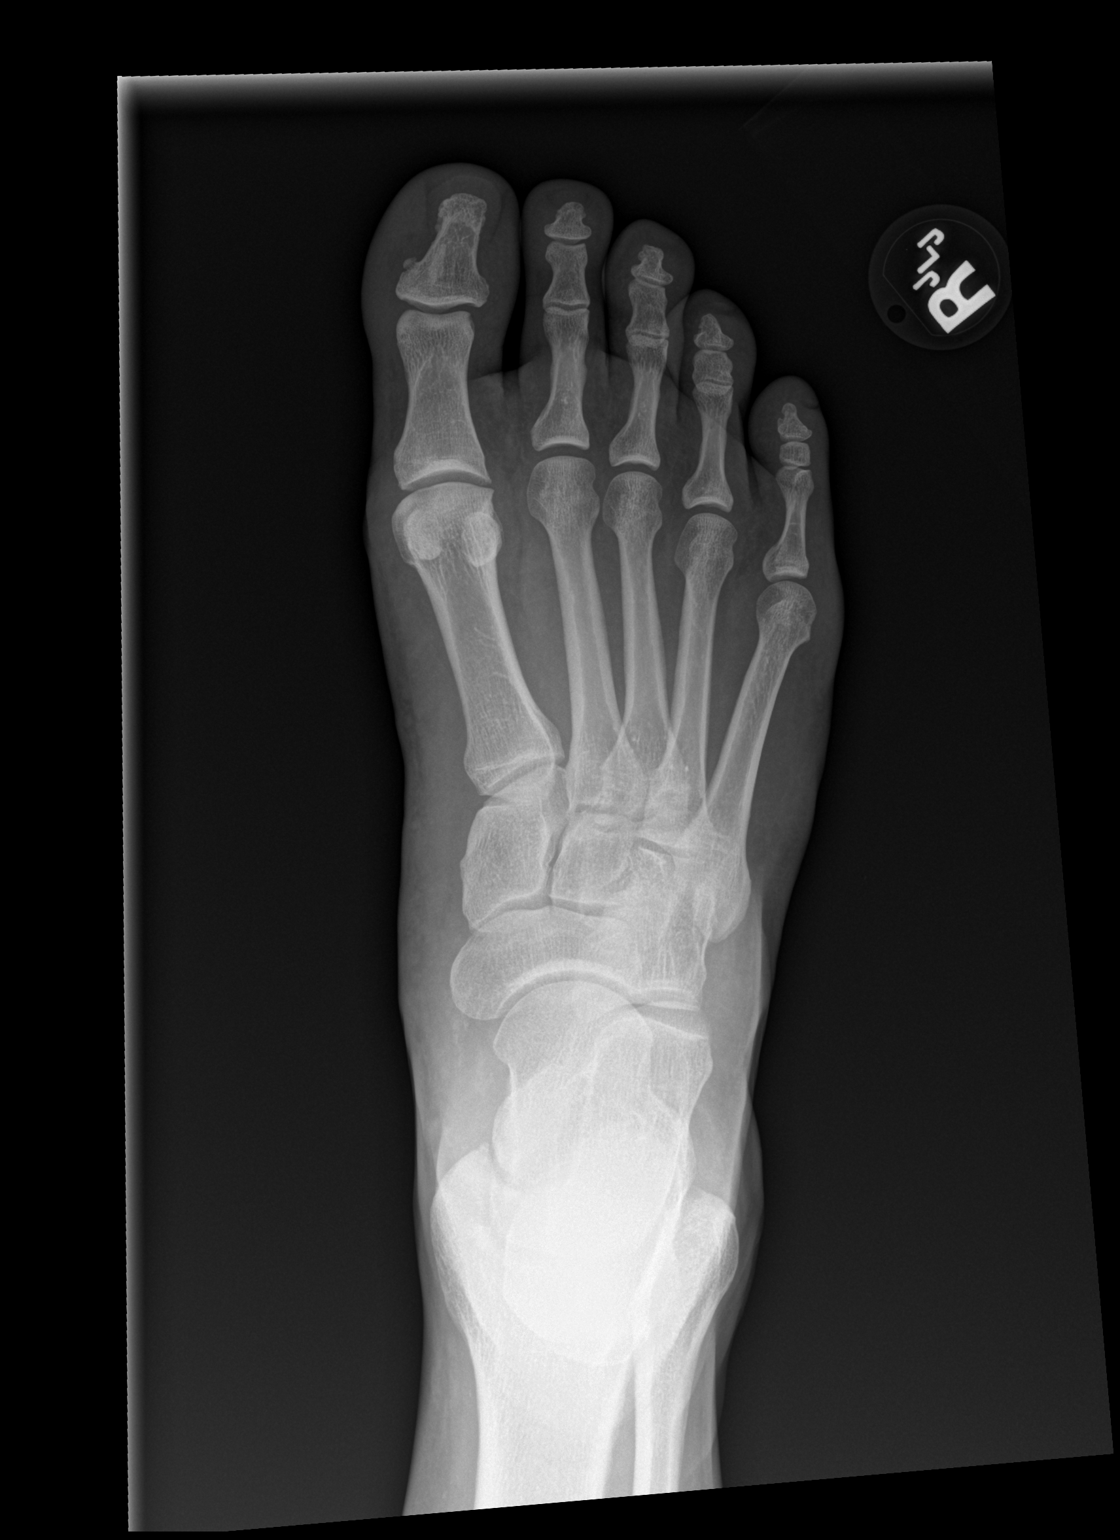

[x foot obl right]
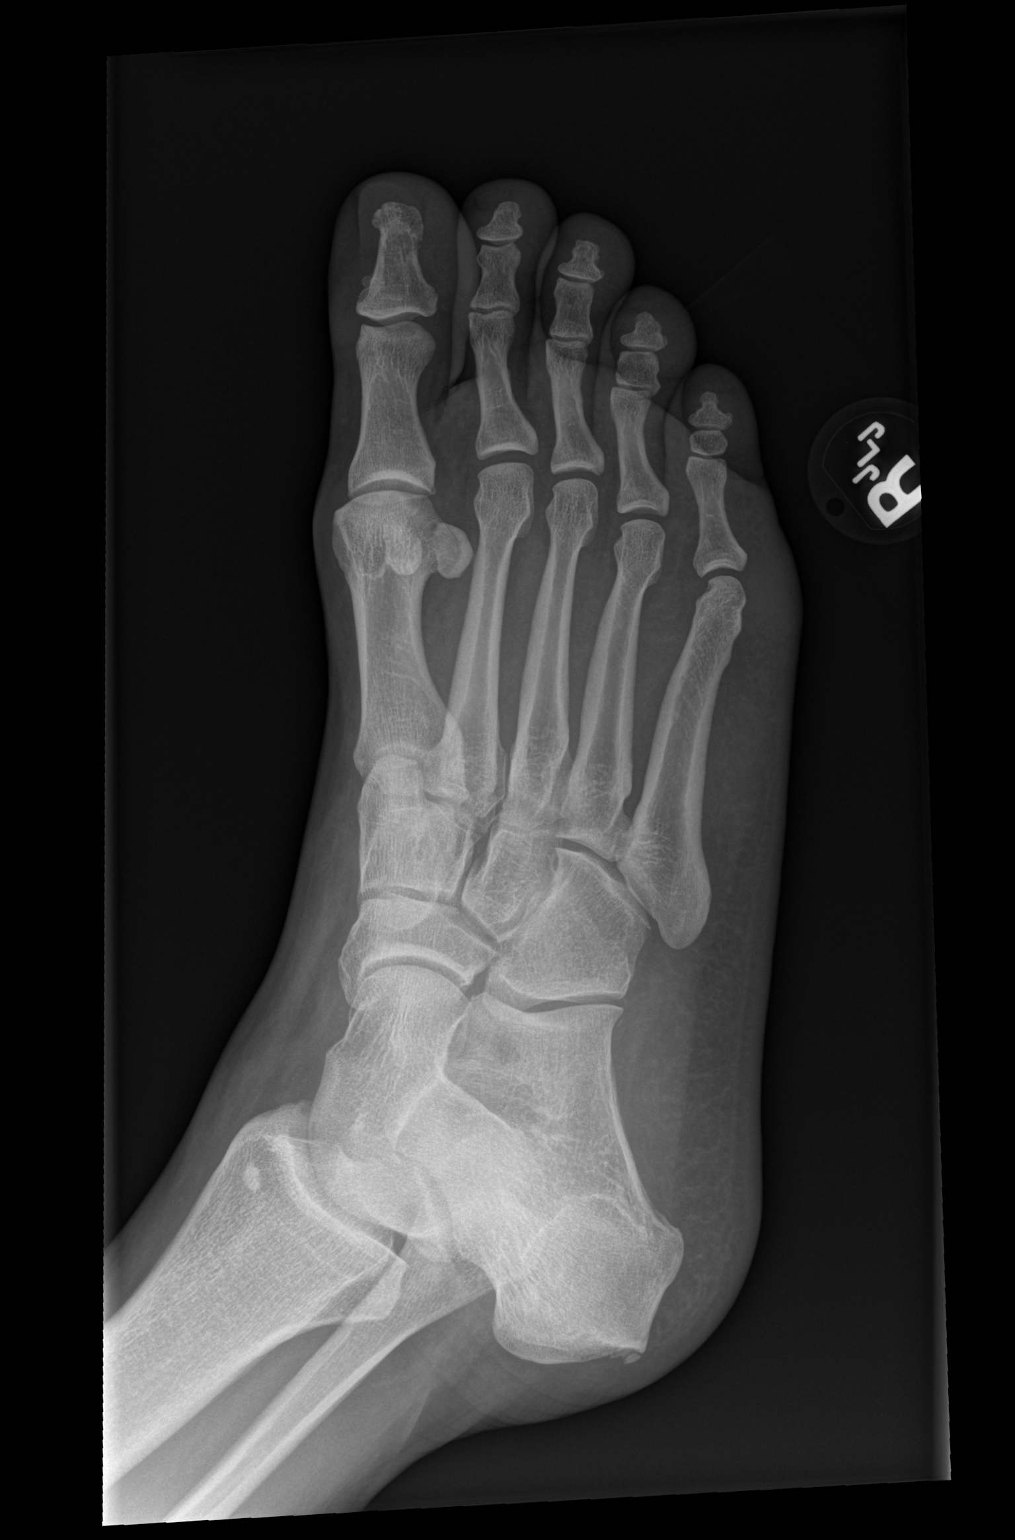

[x foot lat right]
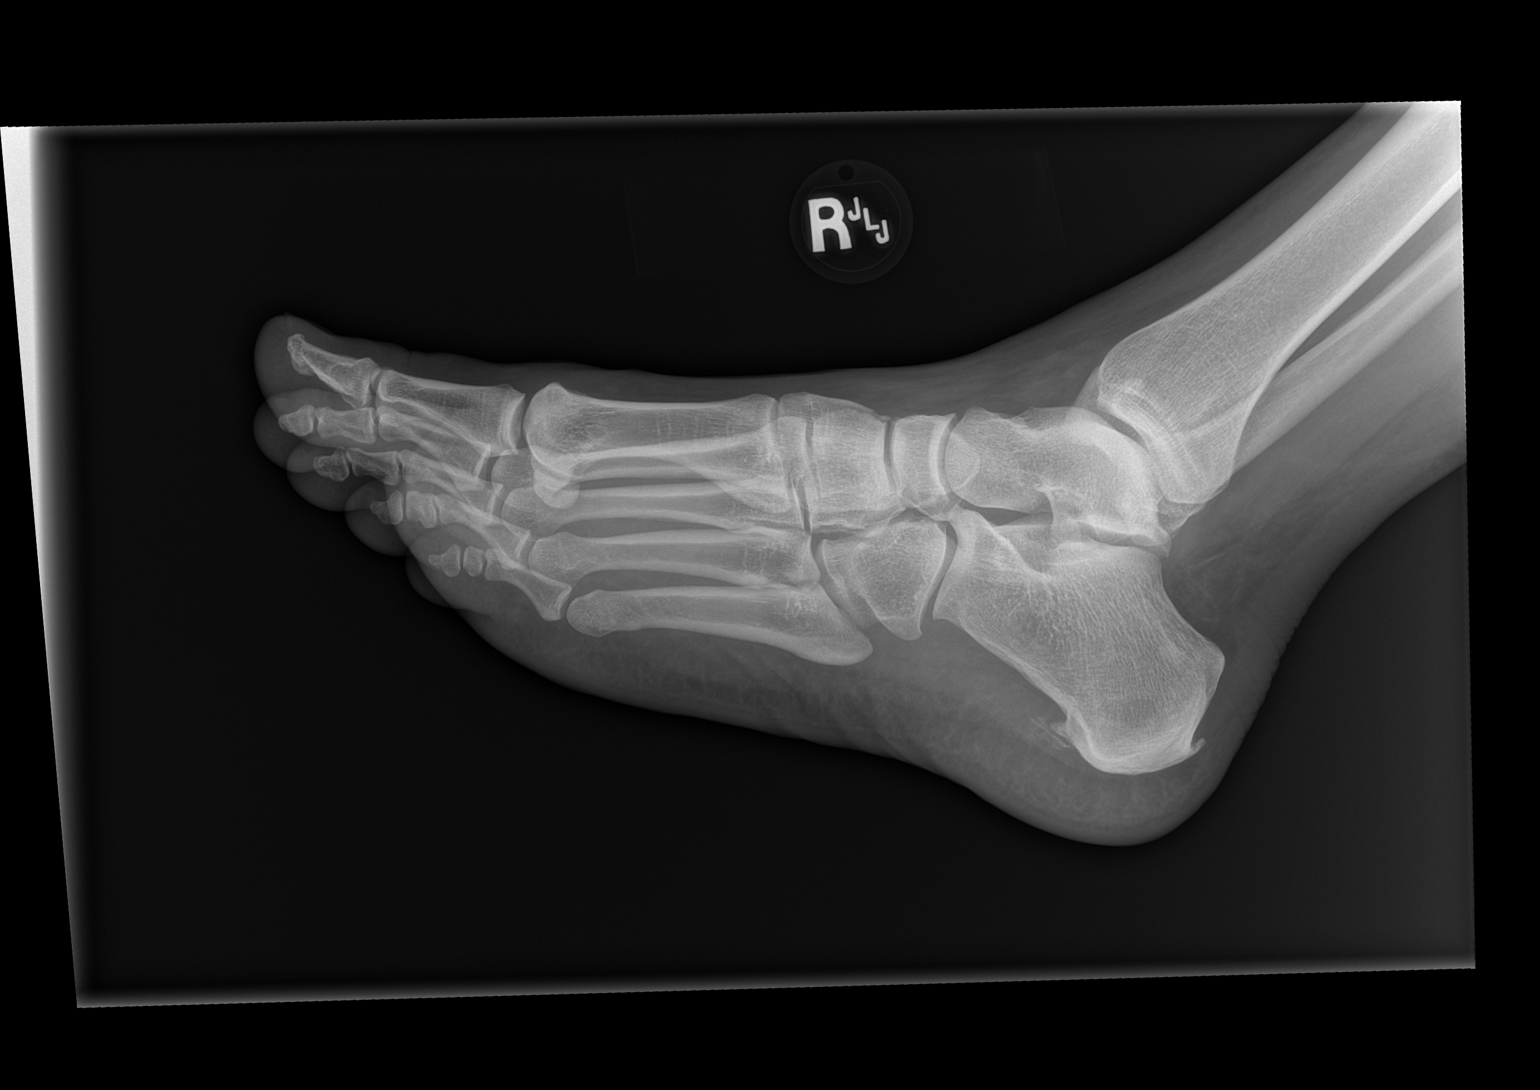

[3 of 3 positions shown; findings below may reference images not displayed]

FINDINGS: There is no evidence of fracture or dislocation. There is no
evidence of arthropathy or other focal bone abnormality. Small
enthesophytes off the plantar and dorsal aspect of the calcaneus.
Soft tissues are unremarkable.
IMPRESSION: No acute radiographic abnormality of the right foot.
